# Patient Record
Sex: Female | Born: 1980 | Race: White | Hispanic: No | Marital: Married | Smoking: Never smoker
Health system: Southern US, Community
[De-identification: ages and names within clinical notes are randomized; demographics above are authoritative.]

## PROBLEM LIST (undated history)

## (undated) ENCOUNTER — Emergency Department: Admission: EM | Disposition: A | Discharge status: Home / Self Care | Payer: Self-pay

## (undated) DIAGNOSIS — J189 Pneumonia, unspecified organism: Secondary | ICD-10-CM

## (undated) DIAGNOSIS — N200 Calculus of kidney: Secondary | ICD-10-CM

## (undated) DIAGNOSIS — M519 Unspecified thoracic, thoracolumbar and lumbosacral intervertebral disc disorder: Secondary | ICD-10-CM

## (undated) DIAGNOSIS — K429 Umbilical hernia without obstruction or gangrene: Secondary | ICD-10-CM

## (undated) HISTORY — PX: HERNIA REPAIR: SHX51

## (undated) HISTORY — PX: APPENDECTOMY: SHX54

## (undated) HISTORY — PX: TUBAL LIGATION: SHX77

---

## 2010-06-30 ENCOUNTER — Ambulatory Visit: Payer: Self-pay | Admitting: Diagnostic Radiology

## 2010-06-30 ENCOUNTER — Emergency Department (HOSPITAL_BASED_OUTPATIENT_CLINIC_OR_DEPARTMENT_OTHER): Admission: EM | Admit: 2010-06-30 | Discharge: 2010-06-30 | Payer: Self-pay | Admitting: Emergency Medicine

## 2010-08-23 ENCOUNTER — Emergency Department (HOSPITAL_BASED_OUTPATIENT_CLINIC_OR_DEPARTMENT_OTHER): Admission: EM | Admit: 2010-08-23 | Discharge: 2010-08-23 | Payer: Self-pay | Admitting: Emergency Medicine

## 2010-08-30 ENCOUNTER — Emergency Department (HOSPITAL_BASED_OUTPATIENT_CLINIC_OR_DEPARTMENT_OTHER): Admission: EM | Admit: 2010-08-30 | Discharge: 2010-08-30 | Payer: Self-pay | Admitting: Emergency Medicine

## 2010-08-30 ENCOUNTER — Ambulatory Visit: Payer: Self-pay | Admitting: Interventional Radiology

## 2011-02-18 LAB — URINALYSIS, ROUTINE W REFLEX MICROSCOPIC
Bilirubin Urine: NEGATIVE
Hgb urine dipstick: NEGATIVE
Nitrite: NEGATIVE
Specific Gravity, Urine: 1.022 (ref 1.005–1.030)

## 2011-02-18 LAB — PREGNANCY, URINE: Preg Test, Ur: NEGATIVE

## 2011-02-18 LAB — POCT I-STAT, CHEM 8
Creatinine, Ser: 0.8 mg/dL (ref 0.4–1.2)
Glucose, Bld: 115 mg/dL — ABNORMAL HIGH (ref 70–99)
HCT: 36 % (ref 36.0–46.0)
Potassium: 4.7 mEq/L (ref 3.5–5.1)
Sodium: 139 mEq/L (ref 135–145)
TCO2: 27 mmol/L (ref 0–100)

## 2011-02-18 LAB — COMPREHENSIVE METABOLIC PANEL
Albumin: 3.9 g/dL (ref 3.5–5.2)
Alkaline Phosphatase: 115 U/L (ref 39–117)
CO2: 30 mEq/L (ref 19–32)
Calcium: 9.7 mg/dL (ref 8.4–10.5)
GFR calc non Af Amer: >60 mL/min (ref >60)
Total Bilirubin: 0.6 mg/dL (ref 0.3–1.2)

## 2011-02-20 LAB — URINALYSIS, ROUTINE W REFLEX MICROSCOPIC
Glucose, UA: NEGATIVE mg/dL
Ketones, ur: NEGATIVE mg/dL
Protein, ur: NEGATIVE mg/dL
Specific Gravity, Urine: 1.029 (ref 1.005–1.030)
Urobilinogen, UA: 1 mg/dL (ref 0.0–1.0)
pH: 6 (ref 5.0–8.0)

## 2011-02-20 LAB — BASIC METABOLIC PANEL
BUN: 17 mg/dL (ref 6–23)
Calcium: 9.5 mg/dL (ref 8.4–10.5)
GFR calc Af Amer: >60 mL/min (ref >60)
Sodium: 144 mEq/L (ref 135–145)

## 2011-02-20 LAB — URINE MICROSCOPIC-ADD ON

## 2011-02-20 LAB — PREGNANCY, URINE: Preg Test, Ur: NEGATIVE

## 2011-02-20 LAB — URINE CULTURE

## 2011-03-16 ENCOUNTER — Emergency Department (INDEPENDENT_AMBULATORY_CARE_PROVIDER_SITE_OTHER): Payer: Self-pay

## 2011-03-16 ENCOUNTER — Emergency Department (HOSPITAL_BASED_OUTPATIENT_CLINIC_OR_DEPARTMENT_OTHER)
Admission: EM | Admit: 2011-03-16 | Discharge: 2011-03-16 | Disposition: A | Discharge status: Home / Self Care | Payer: Self-pay | Attending: Emergency Medicine | Admitting: Emergency Medicine

## 2011-03-16 DIAGNOSIS — R1110 Vomiting, unspecified: Secondary | ICD-10-CM

## 2011-03-16 DIAGNOSIS — N83209 Unspecified ovarian cyst, unspecified side: Secondary | ICD-10-CM | POA: Insufficient documentation

## 2011-03-16 DIAGNOSIS — R1031 Right lower quadrant pain: Secondary | ICD-10-CM

## 2011-03-16 DIAGNOSIS — R1900 Intra-abdominal and pelvic swelling, mass and lump, unspecified site: Secondary | ICD-10-CM

## 2011-03-16 DIAGNOSIS — B9689 Other specified bacterial agents as the cause of diseases classified elsewhere: Secondary | ICD-10-CM | POA: Insufficient documentation

## 2011-03-16 DIAGNOSIS — N760 Acute vaginitis: Secondary | ICD-10-CM | POA: Insufficient documentation

## 2011-03-16 DIAGNOSIS — A499 Bacterial infection, unspecified: Secondary | ICD-10-CM | POA: Insufficient documentation

## 2011-03-16 LAB — BASIC METABOLIC PANEL
CO2: 25 mEq/L (ref 19–32)
Calcium: 9.4 mg/dL (ref 8.4–10.5)
GFR calc Af Amer: >60 mL/min (ref >60)
Glucose, Bld: 96 mg/dL (ref 70–99)
Potassium: 3.9 mEq/L (ref 3.5–5.1)
Sodium: 144 mEq/L (ref 135–145)

## 2011-03-16 LAB — BASIC METABOLIC PANEL WITH GFR
BUN: 16 mg/dL (ref 6–23)
Chloride: 104 meq/L (ref 96–112)
Creatinine, Ser: 0.7 mg/dL (ref 0.4–1.2)
GFR calc non Af Amer: >60 mL/min (ref >60)

## 2011-03-16 LAB — URINALYSIS, ROUTINE W REFLEX MICROSCOPIC
Bilirubin Urine: NEGATIVE
Glucose, UA: NEGATIVE mg/dL
Hgb urine dipstick: NEGATIVE
Ketones, ur: NEGATIVE mg/dL
Nitrite: NEGATIVE
Protein, ur: NEGATIVE mg/dL
Specific Gravity, Urine: 1.028 (ref 1.005–1.030)
Urobilinogen, UA: 0.2 mg/dL (ref 0.0–1.0)
pH: 6 (ref 5.0–8.0)

## 2011-03-16 LAB — WET PREP, GENITAL
Trich, Wet Prep: NONE SEEN
Yeast Wet Prep HPF POC: NONE SEEN

## 2011-03-16 LAB — PREGNANCY, URINE: Preg Test, Ur: NEGATIVE

## 2011-03-17 LAB — GC/CHLAMYDIA PROBE AMP, GENITAL
Chlamydia, DNA Probe: POSITIVE — AB
GC Probe Amp, Genital: NEGATIVE

## 2011-07-27 ENCOUNTER — Emergency Department (HOSPITAL_BASED_OUTPATIENT_CLINIC_OR_DEPARTMENT_OTHER)
Admission: EM | Admit: 2011-07-27 | Discharge: 2011-07-28 | Disposition: A | Discharge status: Home / Self Care | Payer: Self-pay | Attending: Emergency Medicine | Admitting: Emergency Medicine

## 2011-07-27 ENCOUNTER — Encounter: Payer: Self-pay | Admitting: *Deleted

## 2011-07-27 DIAGNOSIS — T81329A Deep disruption or dehiscence of operation wound, unspecified, initial encounter: Secondary | ICD-10-CM | POA: Insufficient documentation

## 2011-07-27 DIAGNOSIS — T8130XA Disruption of wound, unspecified, initial encounter: Secondary | ICD-10-CM

## 2011-07-27 DIAGNOSIS — R110 Nausea: Secondary | ICD-10-CM | POA: Insufficient documentation

## 2011-07-27 DIAGNOSIS — T8132XA Disruption of internal operation (surgical) wound, not elsewhere classified, initial encounter: Secondary | ICD-10-CM | POA: Insufficient documentation

## 2011-07-27 DIAGNOSIS — Y838 Other surgical procedures as the cause of abnormal reaction of the patient, or of later complication, without mention of misadventure at the time of the procedure: Secondary | ICD-10-CM | POA: Insufficient documentation

## 2011-07-27 HISTORY — DX: Umbilical hernia without obstruction or gangrene: K42.9

## 2011-07-27 HISTORY — DX: Calculus of kidney: N20.0

## 2011-07-27 LAB — URINALYSIS, ROUTINE W REFLEX MICROSCOPIC
Glucose, UA: NEGATIVE mg/dL
Ketones, ur: NEGATIVE mg/dL
Protein, ur: NEGATIVE mg/dL
Specific Gravity, Urine: 1.037 — ABNORMAL HIGH (ref 1.005–1.030)
Urobilinogen, UA: 1 mg/dL (ref 0.0–1.0)

## 2011-07-27 NOTE — ED Notes (Signed)
Pt states that she did have a postoperative infection following the hernia surgery, and that she was admitted into the hospital for ABX infusions. States she has not had pain to site since that time. States incision site has had drainage today, and ? Partially dehiscing. States pain is severe and is taking tylenol without relief.

## 2011-07-27 NOTE — ED Notes (Signed)
Pt reports abdominal pain since yesterday, worsened today. States she had an umbilical hernia removed on May 1st of this year, and the pain is over the incision site. Denies urinary or vaginal symptoms. +nausea, but denies vomiting or diarrhea

## 2011-07-28 ENCOUNTER — Encounter (HOSPITAL_BASED_OUTPATIENT_CLINIC_OR_DEPARTMENT_OTHER): Payer: Self-pay | Admitting: *Deleted

## 2011-07-28 ENCOUNTER — Emergency Department (INDEPENDENT_AMBULATORY_CARE_PROVIDER_SITE_OTHER): Payer: Self-pay

## 2011-07-28 DIAGNOSIS — R109 Unspecified abdominal pain: Secondary | ICD-10-CM

## 2011-07-28 DIAGNOSIS — Y838 Other surgical procedures as the cause of abnormal reaction of the patient, or of later complication, without mention of misadventure at the time of the procedure: Secondary | ICD-10-CM

## 2011-07-28 DIAGNOSIS — IMO0002 Reserved for concepts with insufficient information to code with codable children: Secondary | ICD-10-CM

## 2011-07-28 LAB — COMPREHENSIVE METABOLIC PANEL
ALT: 43 U/L — ABNORMAL HIGH (ref 0–35)
AST: 28 U/L (ref 0–37)
Alkaline Phosphatase: 115 U/L (ref 39–117)
CO2: 27 mEq/L (ref 19–32)
Calcium: 10.5 mg/dL (ref 8.4–10.5)
GFR calc non Af Amer: >60 mL/min (ref >60)
Potassium: 3.5 mEq/L (ref 3.5–5.1)
Sodium: 138 mEq/L (ref 135–145)

## 2011-07-28 LAB — CBC
MCV: 78.5 fL (ref 78.0–100.0)
Platelets: 220 10*3/uL (ref 150–400)
RBC: 4.75 MIL/uL (ref 3.87–5.11)
RDW: 13.1 % (ref 11.5–15.5)
WBC: 8.8 10*3/uL (ref 4.0–10.5)

## 2011-07-28 LAB — DIFFERENTIAL
Basophils Absolute: 0 10*3/uL (ref 0.0–0.1)
Eosinophils Relative: 1 % (ref 0–5)
Lymphocytes Relative: 38 % (ref 12–46)
Lymphs Abs: 3.4 10*3/uL (ref 0.7–4.0)
Neutro Abs: 4.7 10*3/uL (ref 1.7–7.7)
Neutrophils Relative %: 53 % (ref 43–77)

## 2011-07-28 MED ORDER — HYDROMORPHONE HCL 1 MG/ML IJ SOLN: 1.0000 mg | Freq: Once | INTRAMUSCULAR | Status: DC

## 2011-07-28 MED ORDER — IOHEXOL 300 MG/ML  SOLN
100.0000 mL | Freq: Once | INTRAMUSCULAR | Status: AC | PRN
  Administered 2011-07-28: 100 mL via INTRAVENOUS

## 2011-07-28 MED ORDER — ONDANSETRON HCL 4 MG/2ML IJ SOLN
4.0000 mg | Freq: Once | INTRAMUSCULAR | Status: AC
  Administered 2011-07-28: 4 mg via INTRAVENOUS
  Filled 2011-07-28: qty 2

## 2011-07-28 MED ORDER — HYDROMORPHONE HCL 1 MG/ML IJ SOLN
1.0000 mg | INTRAMUSCULAR | Status: DC
  Administered 2011-07-28: 1 mg via INTRAVENOUS
  Filled 2011-07-28: qty 1

## 2011-07-28 MED ORDER — DOXYCYCLINE HYCLATE 100 MG PO CAPS: 100.0000 mg | ORAL_CAPSULE | Freq: Two times a day (BID) | ORAL | Status: AC

## 2011-07-28 MED ORDER — HYDROMORPHONE HCL 1 MG/ML IJ SOLN
1.0000 mg | Freq: Once | INTRAMUSCULAR | Status: AC
  Administered 2011-07-28: 1 mg via INTRAVENOUS
  Filled 2011-07-28: qty 1

## 2011-07-28 MED ORDER — DOXYCYCLINE HYCLATE 100 MG PO TABS
100.0000 mg | ORAL_TABLET | Freq: Once | ORAL | Status: AC
  Administered 2011-07-28: 100 mg via ORAL
  Filled 2011-07-28: qty 1

## 2011-07-28 MED ORDER — CEPHALEXIN 500 MG PO CAPS: 500.0000 mg | ORAL_CAPSULE | Freq: Four times a day (QID) | ORAL | Status: AC

## 2011-07-28 MED ORDER — CEPHALEXIN 250 MG PO CAPS
250.0000 mg | ORAL_CAPSULE | Freq: Once | ORAL | Status: AC
  Administered 2011-07-28: 250 mg via ORAL
  Filled 2011-07-28: qty 1

## 2011-07-28 MED ORDER — OXYCODONE-ACETAMINOPHEN 5-325 MG PO TABS: 2.0000 | ORAL_TABLET | ORAL | Status: AC | PRN

## 2011-07-28 NOTE — ED Provider Notes (Addendum)
History     CSN: [id number] Arrival date & time: 07/27/2011 11:29 PM  Chief Complaint  Patient presents with  . Abdominal Pain   Patient is a 30 y.o. female presenting with abdominal pain. The history is provided by the patient. No language interpreter was used.  Abdominal Pain The primary symptoms of the illness include abdominal pain and nausea. The primary symptoms of the illness do not include fever, vomiting, hematochezia, dysuria, vaginal discharge or vaginal bleeding. The current episode started 13 to 24 hours ago. The onset of the illness was gradual. The problem has not changed since onset. The patient states that she believes she is currently not pregnant. The patient has not had a change in bowel habit. Risk factors for an acute abdominal problem include a history of abdominal surgery. Symptoms associated with the illness do not include chills, anorexia, diaphoresis, heartburn, constipation, urgency, hematuria, frequency or back pain. Significant associated medical issues do not include PUD or substance abuse.  Opening of abdominal wound just below umbilicus from May 2012 hernia surgery with serosanguinous drainage.  No f/c/r. No vaginal sx.  No rashes.    Past Medical History  Diagnosis Date  . Umbilical hernia   . Kidney stones     Past Surgical History  Procedure Date  . Appendectomy   . Cesarean section   . Tubal ligation     History reviewed. No pertinent family history.  History  Substance Use Topics  . Smoking status: Never Smoker   . Smokeless tobacco: Not on file  . Alcohol Use: No    OB History    Grav Para Term Preterm Abortions TAB SAB Ect Mult Living                  Review of Systems  Constitutional: Negative for fever, chills and diaphoresis.  Respiratory: Negative for apnea.   Cardiovascular: Negative for chest pain.  Gastrointestinal: Positive for nausea and abdominal pain. Negative for heartburn, vomiting, constipation, hematochezia and  anorexia.  Genitourinary: Positive for difficulty urinating. Negative for dysuria, urgency, frequency, hematuria, vaginal bleeding and vaginal discharge.  Musculoskeletal: Negative for back pain.  Skin: Negative for color change.  Neurological: Negative for dizziness.  Hematological: Negative for adenopathy.  Psychiatric/Behavioral: Negative for agitation.    Physical Exam  BP 134/59  Pulse 100  Temp(Src) 98.5 F (36.9 C) (Oral)  Resp 20  Ht 5\' 4"  (1.626 m)  Wt 295 lb (133.811 kg)  BMI 50.64 kg/m2  SpO2 96%  LMP 07/07/2011  Physical Exam  Constitutional: She is oriented to person, place, and time. She appears well-developed and well-nourished. No distress.  HENT:  Head: Normocephalic and atraumatic.  Eyes: EOM are normal. Pupils are equal, round, and reactive to light.  Neck: Normal range of motion. Neck supple.  Cardiovascular: Normal rate and regular rhythm.   Pulmonary/Chest: Effort normal and breath sounds normal. No respiratory distress.  Abdominal: Soft. Bowel sounds are normal. She exhibits no distension. There is tenderness. There is rebound. There is no guarding.       2 cm opening at incision site just below umbilicus, with serous drainage.  No warmth erythema or induration  Musculoskeletal: Normal range of motion. She exhibits no edema.  Neurological: She is alert and oriented to person, place, and time.  Skin: Skin is warm and dry.  Psychiatric: She has a normal mood and affect.    ED Course  Procedures  MDM Return immediately for fevers, chills, worsening pain or inability to  tolerate medications or streaking, return for wound check in 48 hours.  Call your surgeon in the am.  Patient verbalizes understanding and agrees to follow up      Michaeal Davis K Sherida Dobkins-Rasch, MD 07/28/11 0213  Jasmine Awe, MD 07/28/11 [phone]

## 2011-10-23 ENCOUNTER — Emergency Department (HOSPITAL_BASED_OUTPATIENT_CLINIC_OR_DEPARTMENT_OTHER)
Admission: EM | Admit: 2011-10-23 | Discharge: 2011-10-23 | Disposition: A | Discharge status: Home / Self Care | Payer: Self-pay

## 2011-10-24 ENCOUNTER — Emergency Department (HOSPITAL_BASED_OUTPATIENT_CLINIC_OR_DEPARTMENT_OTHER)
Admission: EM | Admit: 2011-10-24 | Discharge: 2011-10-24 | Disposition: A | Discharge status: Home / Self Care | Payer: Self-pay | Attending: Emergency Medicine | Admitting: Emergency Medicine

## 2011-10-24 ENCOUNTER — Encounter (HOSPITAL_BASED_OUTPATIENT_CLINIC_OR_DEPARTMENT_OTHER): Payer: Self-pay | Admitting: *Deleted

## 2011-10-24 DIAGNOSIS — M549 Dorsalgia, unspecified: Secondary | ICD-10-CM | POA: Insufficient documentation

## 2011-10-24 HISTORY — DX: Unspecified thoracic, thoracolumbar and lumbosacral intervertebral disc disorder: M51.9

## 2011-10-24 MED ORDER — OXYCODONE-ACETAMINOPHEN 5-325 MG PO TABS: 2.0000 | ORAL_TABLET | ORAL | Status: AC | PRN

## 2011-10-24 MED ORDER — KETOROLAC TROMETHAMINE 60 MG/2ML IM SOLN
60.0000 mg | Freq: Once | INTRAMUSCULAR | Status: AC
  Administered 2011-10-24: 60 mg via INTRAMUSCULAR
  Filled 2011-10-24: qty 2

## 2011-10-24 NOTE — ED Provider Notes (Signed)
History/physical exam/procedure(s) were performed by non-physician practitioner and as supervising physician I was immediately available for consultation/collaboration. I have reviewed all notes and am in agreement with care and plan.   Sisto Granillo S Yides Saidi, MD 10/24/11 1533 

## 2011-10-24 NOTE — ED Provider Notes (Signed)
History     CSN: [id number] Arrival date & time: 10/24/2011  1:17 PM   First MD Initiated Contact with Patient 10/24/11 1346      Chief Complaint  Patient presents with  . Back Pain    (Consider location/radiation/quality/duration/timing/severity/associated sxs/prior treatment) HPI Comments: With clarification:pt denies numbness and states that it feels like it is asleep with pins and needle sensation:pt states that she is having spasms in her leg  Patient is a 30 y.o. female presenting with back pain. The history is provided by the patient. No language interpreter was used.  Back Pain  This is a recurrent problem. The current episode started more than 1 week ago. The problem occurs constantly. The problem has not changed since onset.The pain is associated with no known injury. The pain is present in the lumbar spine. The quality of the pain is described as shooting and aching. The pain radiates to the right thigh. The pain is severe. The pain is the same all the time. Associated symptoms include paresis. Pertinent negatives include no perianal numbness, no dysuria and no weakness.    Past Medical History  Diagnosis Date  . Umbilical hernia   . Kidney stones   . Disc disorder     Past Surgical History  Procedure Date  . Appendectomy   . Cesarean section   . Tubal ligation   . Hernia repair     History reviewed. No pertinent family history.  History  Substance Use Topics  . Smoking status: Never Smoker   . Smokeless tobacco: Not on file  . Alcohol Use: No    OB History    Grav Para Term Preterm Abortions TAB SAB Ect Mult Living                  Review of Systems  Genitourinary: Negative for dysuria.  Musculoskeletal: Positive for back pain.  Neurological: Negative for weakness.  All other systems reviewed and are negative.    Allergies  Compazine  Home Medications  No current outpatient prescriptions on file.  BP 169/87  Pulse 104  Temp(Src) 98.4 F  (36.9 C) (Oral)  Resp 20  Ht 5\' 4"  (1.626 m)  Wt 298 lb (135.172 kg)  BMI 51.15 kg/m2  SpO2 99%  LMP 10/07/2011  Physical Exam  Nursing note and vitals reviewed. Constitutional: She is oriented to person, place, and time. She appears well-developed and well-nourished.  HENT:  Head: Normocephalic and atraumatic.  Cardiovascular: Normal rate and regular rhythm.   Pulmonary/Chest: Effort normal and breath sounds normal.  Musculoskeletal: Normal range of motion.       Pt tender right paraspinal along the lumbar spine  Neurological: She is oriented to person, place, and time.  Skin: Skin is warm and dry.  Psychiatric: She has a normal mood and affect.    ED Course  Procedures (including critical care time)  Labs Reviewed - No data to display No results found.   1. Back pain       MDM  Pt is not having any neuro deficit:pt is not having any weakness or numbness:will treat symptomatically        Teressa Lower, NP 10/24/11 1424

## 2011-10-24 NOTE — ED Notes (Signed)
Pt states that she has had low back pain with numbness to her right leg for one week. Saw Dr. Kendra Opitz on Friday. Dx'd with a "lumbar disintegrating disc" and PT was recommended. Pt unable to sleep for the last 2 nights. Requesting something for pain.

## 2011-12-01 ENCOUNTER — Emergency Department (INDEPENDENT_AMBULATORY_CARE_PROVIDER_SITE_OTHER): Payer: Self-pay

## 2011-12-01 ENCOUNTER — Encounter (HOSPITAL_BASED_OUTPATIENT_CLINIC_OR_DEPARTMENT_OTHER): Payer: Self-pay | Admitting: *Deleted

## 2011-12-01 ENCOUNTER — Inpatient Hospital Stay (HOSPITAL_BASED_OUTPATIENT_CLINIC_OR_DEPARTMENT_OTHER)
Admission: EM | Admit: 2011-12-01 | Discharge: 2011-12-03 | DRG: 552 | Disposition: A | Discharge status: Home / Self Care | Payer: Self-pay | Source: Ambulatory Visit | Attending: Internal Medicine | Admitting: Internal Medicine

## 2011-12-01 DIAGNOSIS — R262 Difficulty in walking, not elsewhere classified: Secondary | ICD-10-CM | POA: Diagnosis present

## 2011-12-01 DIAGNOSIS — M549 Dorsalgia, unspecified: Secondary | ICD-10-CM

## 2011-12-01 DIAGNOSIS — R339 Retention of urine, unspecified: Secondary | ICD-10-CM | POA: Diagnosis present

## 2011-12-01 DIAGNOSIS — M545 Low back pain, unspecified: Principal | ICD-10-CM | POA: Diagnosis present

## 2011-12-01 DIAGNOSIS — M25552 Pain in left hip: Secondary | ICD-10-CM

## 2011-12-01 DIAGNOSIS — W19XXXA Unspecified fall, initial encounter: Secondary | ICD-10-CM

## 2011-12-01 DIAGNOSIS — F40298 Other specified phobia: Secondary | ICD-10-CM | POA: Diagnosis present

## 2011-12-01 DIAGNOSIS — M25559 Pain in unspecified hip: Secondary | ICD-10-CM

## 2011-12-01 DIAGNOSIS — R3919 Other difficulties with micturition: Secondary | ICD-10-CM

## 2011-12-01 DIAGNOSIS — Z6841 Body Mass Index (BMI) 40.0 and over, adult: Secondary | ICD-10-CM

## 2011-12-01 DIAGNOSIS — T8189XA Other complications of procedures, not elsewhere classified, initial encounter: Secondary | ICD-10-CM | POA: Diagnosis present

## 2011-12-01 DIAGNOSIS — R270 Ataxia, unspecified: Secondary | ICD-10-CM

## 2011-12-01 DIAGNOSIS — Y849 Medical procedure, unspecified as the cause of abnormal reaction of the patient, or of later complication, without mention of misadventure at the time of the procedure: Secondary | ICD-10-CM | POA: Diagnosis present

## 2011-12-01 HISTORY — DX: Pneumonia, unspecified organism: J18.9

## 2011-12-01 LAB — URINALYSIS, ROUTINE W REFLEX MICROSCOPIC
Glucose, UA: NEGATIVE mg/dL
Hgb urine dipstick: NEGATIVE
Ketones, ur: 15 mg/dL — AB
Leukocytes, UA: NEGATIVE
pH: 6 (ref 5.0–8.0)

## 2011-12-01 LAB — BASIC METABOLIC PANEL
Calcium: 9.4 mg/dL (ref 8.4–10.5)
Creatinine, Ser: 0.7 mg/dL (ref 0.50–1.10)
GFR calc Af Amer: >90 mL/min (ref >90)
GFR calc non Af Amer: >90 mL/min (ref >90)
Sodium: 133 mEq/L — ABNORMAL LOW (ref 135–145)

## 2011-12-01 LAB — DIFFERENTIAL
Basophils Absolute: 0 10*3/uL (ref 0.0–0.1)
Eosinophils Relative: 0 % (ref 0–5)
Lymphocytes Relative: 12 % (ref 12–46)
Neutro Abs: 10.9 10*3/uL — ABNORMAL HIGH (ref 1.7–7.7)
Neutrophils Relative %: 82 % — ABNORMAL HIGH (ref 43–77)

## 2011-12-01 LAB — CBC
Platelets: 186 10*3/uL (ref 150–400)
RDW: 13.8 % (ref 11.5–15.5)
WBC: 13.4 10*3/uL — ABNORMAL HIGH (ref 4.0–10.5)

## 2011-12-01 LAB — URINE MICROSCOPIC-ADD ON

## 2011-12-01 LAB — PREGNANCY, URINE: Preg Test, Ur: NEGATIVE

## 2011-12-01 MED ORDER — KETOROLAC TROMETHAMINE 60 MG/2ML IM SOLN
60.0000 mg | Freq: Once | INTRAMUSCULAR | Status: AC
  Administered 2011-12-01: 60 mg via INTRAMUSCULAR
  Filled 2011-12-01: qty 2

## 2011-12-01 MED ORDER — ONDANSETRON 8 MG PO TBDP
8.0000 mg | ORAL_TABLET | Freq: Once | ORAL | Status: AC
  Administered 2011-12-01: 8 mg via ORAL
  Filled 2011-12-01: qty 1

## 2011-12-01 MED ORDER — ONDANSETRON HCL 4 MG/2ML IJ SOLN
4.0000 mg | Freq: Once | INTRAMUSCULAR | Status: AC
  Administered 2011-12-01: 4 mg via INTRAVENOUS
  Filled 2011-12-01: qty 2

## 2011-12-01 MED ORDER — SODIUM CHLORIDE 0.9 % IV SOLN
Freq: Once | INTRAVENOUS | Status: AC
  Administered 2011-12-01: 1000 mL via INTRAVENOUS

## 2011-12-01 MED ORDER — HYDROMORPHONE HCL PF 1 MG/ML IJ SOLN
1.0000 mg | INTRAMUSCULAR | Status: DC | PRN
  Filled 2011-12-01: qty 1

## 2011-12-01 MED ORDER — HYDROMORPHONE HCL PF 1 MG/ML IJ SOLN
1.0000 mg | Freq: Once | INTRAMUSCULAR | Status: AC
  Administered 2011-12-01: 1 mg via INTRAVENOUS
  Filled 2011-12-01: qty 1

## 2011-12-01 MED ORDER — HYDROMORPHONE HCL PF 1 MG/ML IJ SOLN
1.0000 mg | Freq: Once | INTRAMUSCULAR | Status: AC
  Administered 2011-12-01: 1 mg via INTRAVENOUS

## 2011-12-01 NOTE — ED Notes (Signed)
Pt c/o unable to void x 12 hrs

## 2011-12-01 NOTE — ED Notes (Signed)
Returned from [employer] on side ,states both legs were numb in xray but now feeling coming back in one

## 2011-12-01 NOTE — ED Provider Notes (Signed)
Reports had multiple falls at home this morning, landed on left hip is only complains of left hip pain and low back pain and unable to urinate for the past 12 hours. THERE was placed by the nurse which had approximately 500 mL of urine. She treated for pneumonia a few days ago. Reports having trouble moving her left leg On exam morbidly obese alert nontoxic Glasgow Coma Score 15 entire spine is nontender lungs clear to auscultation abdomen is a bandage wound which is chronic. Pelvis is stable she is mildly tender at the left hip. She reports being unable to move left leg however on changing positions in bed and with distraction she does have some movement of the left lower extremity at hip and knee . Rectal exam shows normal on DTRs are absent at knee jerk and ankle jerks bilaterally. Will obtain x-rays to rule out fracture. I feel that patient needs MRI to rule out discitis or cauda equina syndrome. Conversion reaction is also a possibility  Doug Sou, MD 12/01/11 2008

## 2011-12-01 NOTE — ED Notes (Signed)
Report given to Paula,RN with [facility]. 

## 2011-12-01 NOTE — ED Provider Notes (Signed)
History     CSN: [id number]  Arrival date & time 12/01/11  1746   First MD Initiated Contact with Patient 12/01/11 1843     7:12 PM HPI Patient reports several weeks of back pain. States pain has worsened. Reports frequent falls that began this morning do to severe pain in back and hip. Describes it as "feels like my leg is giving out on me". States she has noticed that since her fall she is not been able to urinate. Reports this is what brought her to the emergency department. States she feels is associated with lower abdominal pain has gradually worsened. States pain in her lower abdomen seems to begin in her lower back and radiates around her to her lower abdomen. States the last time she urinated was this morning when she woke up. Denies dysuria, hematuria, urinary frequency, urinary urgency.     Patient is a 30 y.o. female presenting with hip pain. The history is provided by the patient.  Hip Pain This is a new problem. The current episode started more than 1 month ago. The problem occurs constantly. The problem has been gradually worsening. Associated symptoms include numbness, urinary symptoms (urinary retention) and weakness. Pertinent negatives include no abdominal pain, chest pain, chills, fever, headaches, neck pain or vomiting. The symptoms are aggravated by bending, walking and standing. She has tried nothing for the symptoms.    Past Medical History  Diagnosis Date  . Umbilical hernia   . Kidney stones   . Disc disorder   . Pneumonia     Past Surgical History  Procedure Date  . Appendectomy   . Cesarean section   . Tubal ligation   . Hernia repair     History reviewed. No pertinent family history.  History  Substance Use Topics  . Smoking status: Never Smoker   . Smokeless tobacco: Not on file  . Alcohol Use: No    OB History    Grav Para Term Preterm Abortions TAB SAB Ect Mult Living                  Review of Systems  Constitutional: Negative for  fever and chills.  HENT: Negative for neck pain and neck stiffness.   Cardiovascular: Negative for chest pain.  Gastrointestinal: Negative for vomiting and abdominal pain.  Genitourinary: Negative for dysuria, urgency, frequency, hematuria, flank pain and pelvic pain.  Musculoskeletal: Positive for back pain.       Back and hip pain. Lateral thigh numbness. Denies saddle anesthesias, or perineal numbness, urinary or bowel incontinence  Neurological: Positive for weakness and numbness. Negative for headaches.  All other systems reviewed and are negative.    Allergies  Compazine  Home Medications   Current Outpatient Rx  Name Route Sig Dispense Refill  . ACETAMINOPHEN 500 MG PO TABS Oral Take 1,000 mg by mouth every 8 (eight) hours as needed. For fever     . HYDROCOD POLST-CHLORPHEN POLST 10-8 MG/5ML PO LQCR Oral Take 5 mLs by mouth every 12 (twelve) hours as needed. For cough     . DOXYCYCLINE HYCLATE 100 MG PO CAPS Oral Take 100 mg by mouth 2 (two) times daily.        BP 108/42  Pulse 116  Temp(Src) 98.5 F (36.9 C) (Oral)  Resp 18  Ht 5\' 4"  (1.626 m)  Wt 323 lb (146.512 kg)  BMI 55.44 kg/m2  SpO2 98%  LMP 11/06/2011  Physical Exam  Vitals reviewed. Constitutional: She is oriented to  person, place, and time. Vital signs are normal. She appears well-developed and well-nourished.  HENT:  Head: Normocephalic and atraumatic.  Eyes: Conjunctivae are normal. Pupils are equal, round, and reactive to light.  Neck: Normal range of motion. Neck supple.  Cardiovascular: Normal rate, regular rhythm and normal heart sounds.  Exam reveals no friction rub.   No murmur heard. Pulmonary/Chest: Effort normal and breath sounds normal. She has no wheezes. She has no rhonchi. She has no rales. She exhibits no tenderness.  Abdominal: Soft. Bowel sounds are normal. She exhibits no distension and no mass. There is no tenderness. There is no rebound and no guarding.       Morbid obesity. 2 cm  open draining wound in the perimbilical region which is chronic and unchanged according to patient  Musculoskeletal:       Left hip: She exhibits decreased range of motion, decreased strength and tenderness. She exhibits no laceration.       Lumbar back: Normal. She exhibits no tenderness, no bony tenderness and no swelling.       Legs:      Patient reports unable to move her LLE due to pain. Unable to push against resistance with her feet. Felt a vague pulse. This is difficult to assess due to large body habitus.  Neurological: She is alert and oriented to person, place, and time. Coordination normal.  Skin: Skin is warm and dry. No rash noted. No erythema. No pallor.    ED Course  Procedures  Results for orders placed during the hospital encounter of 12/01/11  URINALYSIS, ROUTINE W REFLEX MICROSCOPIC      Component Value Range   Color, Urine YELLOW  YELLOW    APPearance CLEAR  CLEAR    Specific Gravity, Urine 1.025  1.005 - 1.030    pH 6.0  5.0 - 8.0    Glucose, UA NEGATIVE  NEGATIVE (mg/dL)   Hgb urine dipstick NEGATIVE  NEGATIVE    Bilirubin Urine NEGATIVE  NEGATIVE    Ketones, ur 15 (*) NEGATIVE (mg/dL)   Protein, ur 30 (*) NEGATIVE (mg/dL)   Urobilinogen, UA 1.0  0.0 - 1.0 (mg/dL)   Nitrite NEGATIVE  NEGATIVE    Leukocytes, UA NEGATIVE  NEGATIVE   PREGNANCY, URINE      Component Value Range   Preg Test, Ur NEGATIVE    URINE MICROSCOPIC-ADD ON      Component Value Range   Squamous Epithelial / LPF RARE  RARE    RBC / HPF 0-2  <3 (RBC/hpf)   Bacteria, UA RARE  RARE   BASIC METABOLIC PANEL      Component Value Range   Sodium 133 (*) 135 - 145 (mEq/L)   Potassium 3.3 (*) 3.5 - 5.1 (mEq/L)   Chloride 94 (*) 96 - 112 (mEq/L)   CO2 26  19 - 32 (mEq/L)   Glucose, Bld 121 (*) 70 - 99 (mg/dL)   BUN 14  6 - 23 (mg/dL)   Creatinine, Ser 0.45  0.50 - 1.10 (mg/dL)   Calcium 9.4  8.4 - 40.9 (mg/dL)   GFR calc non Af Amer >90  >90 (mL/min)   GFR calc Af Amer >90  >90 (mL/min)    CBC      Component Value Range   WBC 13.4 (*) 4.0 - 10.5 (K/uL)   RBC 4.49  3.87 - 5.11 (MIL/uL)   Hemoglobin 11.1 (*) 12.0 - 15.0 (g/dL)   HCT 81.1 (*) 91.4 - 46.0 (%)   MCV  75.9 (*) 78.0 - 100.0 (fL)   MCH 24.7 (*) 26.0 - 34.0 (pg)   MCHC 32.6  30.0 - 36.0 (g/dL)   RDW 86.5  78.4 - 69.6 (%)   Platelets 186  150 - 400 (K/uL)  DIFFERENTIAL      Component Value Range   Neutrophils Relative 82 (*) 43 - 77 (%)   Neutro Abs 10.9 (*) 1.7 - 7.7 (K/uL)   Lymphocytes Relative 12  12 - 46 (%)   Lymphs Abs 1.7  0.7 - 4.0 (K/uL)   Monocytes Relative 6  3 - 12 (%)   Monocytes Absolute 0.8  0.1 - 1.0 (K/uL)   Eosinophils Relative 0  0 - 5 (%)   Eosinophils Absolute 0.0  0.0 - 0.7 (K/uL)   Basophils Relative 0  0 - 1 (%)   Basophils Absolute 0.0  0.0 - 0.1 (K/uL)   Dg Thoracic Spine 4v  12/01/2011  *RADIOLOGY REPORT*  Clinical Data: Status post multiple falls, with lower back pain and left hip pain.  THORACIC SPINE - 4+ VIEW  Comparison: Chest radiograph performed 08/30/2010, and CT of the abdomen and pelvis performed 08/20 01/2011  Findings: There is no evidence of acute fracture or subluxation. There is mild anterior loss of height at vertebral bodies the T11, T12 and L1, stable from the prior CT.  Vertebral bodies otherwise demonstrate normal height and alignment.  There is narrowing of the intervertebral disc space at T11-T12.  The visualized portions of both lungs are clear.  The mediastinum is unremarkable in appearance.  Contrast is noted within the colon.  IMPRESSION:  1.  No evidence of acute fracture or subluxation. 2.  Mild anterior loss of height at T11, T12 and L1 is stable from prior CT, with mild degenerative change; some of this may be developmental in nature.  Original Report Authenticated By: Tonia Ghent, M.D.   Dg Lumbar Spine Complete  12/01/2011  *RADIOLOGY REPORT*  Clinical Data: Status post multiple falls, with lower back pain and inability to urinate for 12 hours.  LUMBAR  SPINE - COMPLETE 4+ VIEW  Comparison: CT of the abdomen and pelvis performed 07/28/2011  Findings: There is no evidence of acute fracture or subluxation. Mild anterior loss of height at T11, T12 and L1 is stable from prior CT, with mild degenerative change at T11-T12.  Vertebral bodies demonstrate normal alignment.  The visualized bowel gas pattern is unremarkable in appearance; air and stool are noted within the colon.  The sacroiliac joints are within normal limits.  A right-sided tubal ligation clip is again noted; no left-sided tubal ligation clip is seen.  IMPRESSION:  1.  No evidence of acute fracture or subluxation along the lumbar spine. 2.  Stable mild anterior loss of height at T11 and T12 and L1; this may be developmental in nature. 3.  Right-sided tubal ligation clip noted; no left-sided tubal ligation clip seen.  This may reflect prior removal of the left- sided tubal ligation clip.  Original Report Authenticated By: Tonia Ghent, M.D.   Dg Hip Complete Left  12/01/2011  *RADIOLOGY REPORT*  Clinical Data: Status post multiple falls, with left hip pain.  LEFT HIP - COMPLETE 2+ VIEW  Comparison: None.  Findings: There is no evidence of fracture or dislocation.  Both femoral heads are seated normally within their respective acetabula.  The proximal left femur appears intact.  No significant degenerative change is appreciated.  The sacroiliac joints are unremarkable in appearance.  Note is made of  mild diastasis of the pubic symphysis, measuring 1.5 cm.  The visualized bowel gas pattern is grossly unremarkable in appearance.  The patient is status post tubal ligation; no left- sided clip is seen.  On correlation with the prior CT, only a right- sided clip is present; this may reflect prior removal of a left- sided clip.  IMPRESSION:  1.  No evidence of fracture or dislocation. 2.  Mild diastases of the pubic symphysis, measuring 1.5 cm. 3.  No left-sided tubal ligation clip seen.  This was also the  case on prior CTs; this may reflect prior removal of a left-sided clip. Suggest clinical correlation.  Original Report Authenticated By: Tonia Ghent, M.D.      MDM          Thomasene Lot, PA 12/01/11 2155  Spoke with Dr. Toniann Fail, he'll take the patient for admission at [facility] to a regular bed, team 4.  Thomasene Lot, [place] 12/01/11 2218

## 2011-12-01 NOTE — ED Notes (Signed)
Foley removed(500cc of urine returned)  Assisted to BR.  States can't move left leg,that she has fallen several tmes today because her leg gave way

## 2011-12-01 NOTE — ED Notes (Signed)
Has now gotten out of bed to chair states more comfortable there, was not assisted by nursing staff

## 2011-12-02 ENCOUNTER — Inpatient Hospital Stay (HOSPITAL_COMMUNITY): Payer: Self-pay

## 2011-12-02 ENCOUNTER — Encounter (HOSPITAL_COMMUNITY): Payer: Self-pay | Admitting: Internal Medicine

## 2011-12-02 DIAGNOSIS — M545 Low back pain: Secondary | ICD-10-CM | POA: Diagnosis present

## 2011-12-02 LAB — COMPREHENSIVE METABOLIC PANEL
ALT: 18 U/L (ref 0–35)
AST: 21 U/L (ref 0–37)
CO2: 27 mEq/L (ref 19–32)
Calcium: 9 mg/dL (ref 8.4–10.5)
Chloride: 99 mEq/L (ref 96–112)
GFR calc non Af Amer: >90 mL/min (ref >90)
Potassium: 2.9 mEq/L — ABNORMAL LOW (ref 3.5–5.1)
Sodium: 137 mEq/L (ref 135–145)

## 2011-12-02 LAB — RAPID URINE DRUG SCREEN, HOSP PERFORMED
Barbiturates: NOT DETECTED
Benzodiazepines: NOT DETECTED
Cocaine: NOT DETECTED
Tetrahydrocannabinol: NOT DETECTED

## 2011-12-02 LAB — CBC
Hemoglobin: 10 g/dL — ABNORMAL LOW (ref 12.0–15.0)
MCH: 24.6 pg — ABNORMAL LOW (ref 26.0–34.0)
MCHC: 31.5 g/dL (ref 30.0–36.0)

## 2011-12-02 LAB — MAGNESIUM: Magnesium: 2.1 mg/dL (ref 1.5–2.5)

## 2011-12-02 MED ORDER — LORAZEPAM 1 MG PO TABS
2.0000 mg | ORAL_TABLET | Freq: Four times a day (QID) | ORAL | Status: DC | PRN
  Administered 2011-12-02: 1 mg via ORAL
  Administered 2011-12-03 (×2): 2 mg via ORAL
  Filled 2011-12-02: qty 1
  Filled 2011-12-02 (×2): qty 2

## 2011-12-02 MED ORDER — LORAZEPAM 2 MG/ML IJ SOLN
INTRAMUSCULAR | Status: AC
  Administered 2011-12-02: 1 mg
  Filled 2011-12-02: qty 1

## 2011-12-02 MED ORDER — ONDANSETRON HCL 4 MG PO TABS: 4.0000 mg | ORAL_TABLET | Freq: Four times a day (QID) | ORAL | Status: DC | PRN

## 2011-12-02 MED ORDER — MOXIFLOXACIN HCL 400 MG PO TABS
400.0000 mg | ORAL_TABLET | Freq: Every day | ORAL | Status: DC
  Administered 2011-12-02 – 2011-12-03 (×2): 400 mg via ORAL
  Filled 2011-12-02 (×2): qty 1

## 2011-12-02 MED ORDER — LORAZEPAM 1 MG PO TABS
1.0000 mg | ORAL_TABLET | Freq: Once | ORAL | Status: DC
  Filled 2011-12-02: qty 1

## 2011-12-02 MED ORDER — POLYETHYLENE GLYCOL 3350 17 G PO PACK
17.0000 g | PACK | Freq: Every day | ORAL | Status: DC
  Filled 2011-12-02 (×2): qty 1

## 2011-12-02 MED ORDER — ACETAMINOPHEN 650 MG RE SUPP: 650.0000 mg | Freq: Four times a day (QID) | RECTAL | Status: DC | PRN

## 2011-12-02 MED ORDER — LORAZEPAM 1 MG PO TABS
1.0000 mg | ORAL_TABLET | Freq: Four times a day (QID) | ORAL | Status: DC | PRN
  Administered 2011-12-02: 1 mg via ORAL
  Filled 2011-12-02: qty 1

## 2011-12-02 MED ORDER — SODIUM CHLORIDE 0.9 % IV SOLN
INTRAVENOUS | Status: DC
  Administered 2011-12-02: 06:00:00 via INTRAVENOUS

## 2011-12-02 MED ORDER — IOHEXOL 300 MG/ML  SOLN: 100.0000 mL | Freq: Once | INTRAMUSCULAR | Status: AC | PRN

## 2011-12-02 MED ORDER — ONDANSETRON HCL 4 MG/2ML IJ SOLN
4.0000 mg | Freq: Four times a day (QID) | INTRAMUSCULAR | Status: DC | PRN
  Administered 2011-12-02: 4 mg via INTRAVENOUS
  Filled 2011-12-02: qty 2

## 2011-12-02 MED ORDER — HYDROMORPHONE HCL PF 1 MG/ML IJ SOLN
2.0000 mg | INTRAMUSCULAR | Status: DC | PRN
  Administered 2011-12-02: 2 mg via INTRAVENOUS
  Administered 2011-12-02: 1 mg via INTRAVENOUS
  Administered 2011-12-02: 2 mg via INTRAVENOUS
  Administered 2011-12-02: 1 mg via INTRAVENOUS
  Administered 2011-12-02 (×2): 2 mg via INTRAVENOUS
  Administered 2011-12-02: 1 mg via INTRAVENOUS
  Administered 2011-12-03 (×6): 2 mg via INTRAVENOUS
  Filled 2011-12-02 (×3): qty 2
  Filled 2011-12-02 (×2): qty 1
  Filled 2011-12-02 (×8): qty 2

## 2011-12-02 MED ORDER — HYDROMORPHONE HCL PF 1 MG/ML IJ SOLN
1.0000 mg | INTRAMUSCULAR | Status: DC | PRN
  Administered 2011-12-02 (×2): 1 mg via INTRAVENOUS
  Filled 2011-12-02 (×3): qty 1

## 2011-12-02 MED ORDER — HYDROMORPHONE HCL PF 1 MG/ML IJ SOLN
0.5000 mg | INTRAMUSCULAR | Status: DC | PRN
  Administered 2011-12-02: 03:00:00 via INTRAVENOUS
  Filled 2011-12-02: qty 1

## 2011-12-02 MED ORDER — HYDROMORPHONE HCL PF 1 MG/ML IJ SOLN
1.0000 mg | INTRAMUSCULAR | Status: AC
  Administered 2011-12-02: 1 mg via INTRAVENOUS
  Filled 2011-12-02: qty 1

## 2011-12-02 MED ORDER — ACETAMINOPHEN 325 MG PO TABS
650.0000 mg | ORAL_TABLET | Freq: Four times a day (QID) | ORAL | Status: DC | PRN
  Administered 2011-12-02 – 2011-12-03 (×4): 650 mg via ORAL
  Filled 2011-12-02 (×4): qty 2

## 2011-12-02 NOTE — Progress Notes (Signed)
Physical Therapy Cancellation Patient Details Name: Leslie Castillo MRN: [id number] DOB: August 30, 1981 Today's Date: 12/02/2011  Pt refuses PT evaluation at this time due to pain.  RN calling MD for pain medication.  Will attempt eval tomorrow.  Thanks!!  Natina Wiginton 12/02/2011, 2:55 PM [phone]

## 2011-12-02 NOTE — Progress Notes (Signed)
Subjective: Patient relates she still a significant, after milligram of Dilaudid. She relates she's not hungry Objective: Filed Vitals:   12/01/11 2312 12/01/11 2326 12/02/11 0046 12/02/11 0608  BP: 103/36  125/76 129/75  Pulse: 105  96 98  Temp:   98.9 F (37.2 C) 98.3 F (36.8 C)  TempSrc:      Resp:   20 20  Height:  5\' 4"  (1.626 m)    Weight:  146.512 kg (323 lb)    SpO2: 95%  97% 97%   Weight change:   Intake/Output Summary (Last 24 hours) at 12/02/11 0914 Last data filed at 12/02/11 0225  Gross per 24 hour  Intake      0 ml  Output    650 ml  Net   -650 ml    General: Alert, awake, oriented x3, in no acute distress.  HEENT: No bruits, no goiter.  Heart: Regular rate and rhythm, without murmurs, rubs, gallops.  Lungs: Crackles left side, bilateral air movement.  Abdomen: Soft, nontender, nondistended, positive bowel sounds.  Neuro: Grossly intact, nonfocal. Straight leg raise positive on the left negative on the right sensation is intact bilaterally. Motor strength is 5 of 5 in all 4 extremities except the left lower extremity which is limited by pain.  Extremities: Mottle skin.   Lab Results:  [facility] 12/02/11 0550 12/01/11 2013  NA 137 133*  K 2.9* 3.3*  CL 99 94*  CO2 27 26  GLUCOSE 87 121*  BUN 14 14  CREATININE 0.68 0.70  CALCIUM 9.0 9.4  MG 2.1 --  PHOS -- --    Basename 12/02/11 0550  AST 21  ALT 18  ALKPHOS 130*  BILITOT 0.2*  PROT 6.9  ALBUMIN 2.7*   No results found for this basename: LIPASE:2,AMYLASE:2 in the last 72 hours  Basename 12/02/11 0550 12/01/11 2020  WBC 11.8* 13.4*  NEUTROABS -- 10.9*  HGB 10.0* 11.1*  HCT 31.7* 34.1*  MCV 77.9* 75.9*  PLT 167 186   No results found for this basename: CKTOTAL:3,CKMB:3,CKMBINDEX:3,TROPONINI:3 in the last 72 hours No components found with this basename: POCBNP:3 No results found for this basename: DDIMER:2 in the last 72 hours No results found for this basename: HGBA1C:2 in the last 72  hours No results found for this basename: CHOL:2,HDL:2,LDLCALC:2,TRIG:2,CHOLHDL:2,LDLDIRECT:2 in the last 72 hours No results found for this basename: TSH,T4TOTAL,FREET3,T3FREE,THYROIDAB in the last 72 hours No results found for this basename: VITAMINB12:2,FOLATE:2,FERRITIN:2,TIBC:2,IRON:2,RETICCTPCT:2 in the last 72 hours  Micro Results: Recent Results (from the past 240 hour(s))  MRSA PCR SCREENING     Status: Abnormal   Collection Time   12/02/11  3:43 AM      Component Value Range Status Comment   MRSA by PCR POSITIVE (*) NEGATIVE  Final     Studies/Results: Dg Thoracic Spine 4v  12/01/2011  *RADIOLOGY REPORT*  Clinical Data: Status post multiple falls, with lower back pain and left hip pain.  THORACIC SPINE - 4+ VIEW  Comparison: Chest radiograph performed 08/30/2010, and CT of the abdomen and pelvis performed 08/20 01/2011  Findings: There is no evidence of acute fracture or subluxation. There is mild anterior loss of height at vertebral bodies the T11, T12 and L1, stable from the prior CT.  Vertebral bodies otherwise demonstrate normal height and alignment.  There is narrowing of the intervertebral disc space at T11-T12.  The visualized portions of both lungs are clear.  The mediastinum is unremarkable in appearance.  Contrast is noted within the colon.  IMPRESSION:  1.  No evidence of acute fracture or subluxation. 2.  Mild anterior loss of height at T11, T12 and L1 is stable from prior CT, with mild degenerative change; some of this may be developmental in nature.  Original Report Authenticated By: Tonia Ghent, M.D.   Dg Lumbar Spine Complete  12/01/2011  *RADIOLOGY REPORT*  Clinical Data: Status post multiple falls, with lower back pain and inability to urinate for 12 hours.  LUMBAR SPINE - COMPLETE 4+ VIEW  Comparison: CT of the abdomen and pelvis performed 07/28/2011  Findings: There is no evidence of acute fracture or subluxation. Mild anterior loss of height at T11, T12 and L1 is  stable from prior CT, with mild degenerative change at T11-T12.  Vertebral bodies demonstrate normal alignment.  The visualized bowel gas pattern is unremarkable in appearance; air and stool are noted within the colon.  The sacroiliac joints are within normal limits.  A right-sided tubal ligation clip is again noted; no left-sided tubal ligation clip is seen.  IMPRESSION:  1.  No evidence of acute fracture or subluxation along the lumbar spine. 2.  Stable mild anterior loss of height at T11 and T12 and L1; this may be developmental in nature. 3.  Right-sided tubal ligation clip noted; no left-sided tubal ligation clip seen.  This may reflect prior removal of the left- sided tubal ligation clip.  Original Report Authenticated By: Tonia Ghent, M.D.   Dg Hip Complete Left  12/01/2011  *RADIOLOGY REPORT*  Clinical Data: Status post multiple falls, with left hip pain.  LEFT HIP - COMPLETE 2+ VIEW  Comparison: None.  Findings: There is no evidence of fracture or dislocation.  Both femoral heads are seated normally within their respective acetabula.  The proximal left femur appears intact.  No significant degenerative change is appreciated.  The sacroiliac joints are unremarkable in appearance.  Note is made of mild diastasis of the pubic symphysis, measuring 1.5 cm.  The visualized bowel gas pattern is grossly unremarkable in appearance.  The patient is status post tubal ligation; no left- sided clip is seen.  On correlation with the prior CT, only a right- sided clip is present; this may reflect prior removal of a left- sided clip.  IMPRESSION:  1.  No evidence of fracture or dislocation. 2.  Mild diastases of the pubic symphysis, measuring 1.5 cm. 3.  No left-sided tubal ligation clip seen.  This was also the case on prior CTs; this may reflect prior removal of a left-sided clip. Suggest clinical correlation.  Original Report Authenticated By: Tonia Ghent, M.D.    Medications: I have reviewed the patient's  current medications.   Principal Problem:  *Low back pain    Assessment and plan:  -MRI of the lumbar spine is pending. Will increase antibiotics. We'll give her Ativan to get before the MRI. She related 1 mg of Dilaudid does not help.  -Continue her treatment for pneumonia she continues to be afebrile. Her white count continues to go down.   LOS: 1 day   Marinda Elk M.D. Pager: [phone] [facility] 12/02/2011, 9:14 AM

## 2011-12-02 NOTE — ED Provider Notes (Signed)
Medical screening examination/treatment/procedure(s) were conducted as a shared visit with non-physician practitioner(s) and myself.  I personally evaluated the patient during the encounter  Doug Sou, MD 12/02/11 0028

## 2011-12-02 NOTE — H&P (Signed)
Leslie Castillo is an 30 y.o. female.   PCP - None. Chief Complaint: Low back pain and difficulty walking. HPI: 30 year-old female with no significant past medical history presented to the ER because of worsening low back pain and difficulty walking. Patient states 3 days ago she had cough and feverish feeling and had gone to the ER and was told she had pneumonia and was prescribed doxycycline. The next day that is yesterday when she woke up and tried to walk she had intense pain in the low back which was radiating all the way to the left lower extremity. Whenever she put pressure on the leg due to intense pain and leg gave way. She fell 3 times due to the same reason. She came to the ER at [facility]. The patient had x-rays of the T-spine and lumbosacral spine and the left hip all of which does not show anything acute. Patient still had pain and inability to walk. Patient has been admitted for further management. Patient denies any incontinence of urine or bowels. Patient was found to have urinary retention in the ER in and out catheter. After patient came to the hospital patient was able to void urine spontaneously. Presently denies any chest pain or shortness of breath. #1  Past Medical History  Diagnosis Date  . Umbilical hernia   . Kidney stones   . Disc disorder   . Pneumonia     Past Surgical History  Procedure Date  . Appendectomy   . Cesarean section   . Tubal ligation   . Hernia repair     History reviewed. No pertinent family history. Social History:  reports that she has never smoked. She does not have any smokeless tobacco history on file. She reports that she does not drink alcohol or use illicit drugs.  Allergies:  Allergies  Allergen Reactions  . Compazine Other (See Comments)    hallucinations    Medications Prior to Admission  Medication Dose Route Frequency Provider Last Rate Last Dose  . 0.9 %  sodium chloride infusion   Intravenous Once Doug Sou, MD 20 mL/hr at 12/01/11 2051 1,000 mL at 12/01/11 2051  . 0.9 %  sodium chloride infusion   Intravenous Continuous Eduard Clos      . acetaminophen (TYLENOL) tablet 650 mg  650 mg Oral Q6H PRN Eduard Clos       Or  . acetaminophen (TYLENOL) suppository 650 mg  650 mg Rectal Q6H PRN Eduard Clos      . HYDROmorphone (DILAUDID) injection 0.5 mg  0.5 mg Intravenous Q2H PRN Eduard Clos      . HYDROmorphone (DILAUDID) injection 1 mg  1 mg Intravenous Once Doug Sou, MD   1 mg at 12/01/11 2024  . HYDROmorphone (DILAUDID) injection 1 mg  1 mg Intravenous Once Thomasene Lot, PA   1 mg at 12/01/11 2220  . HYDROmorphone (DILAUDID) injection 1 mg  1 mg Intravenous Once Doug Sou, MD   1 mg at 12/01/11 2354  . ketorolac (TORADOL) injection 60 mg  60 mg Intramuscular Once Thomasene Lot, PA   60 mg at 12/01/11 1930  . LORazepam (ATIVAN) tablet 1 mg  1 mg Oral Once Eduard Clos      . moxifloxacin (AVELOX) tablet 400 mg  400 mg Oral q1800 Eduard Clos      . ondansetron (ZOFRAN) injection 4 mg  4 mg Intravenous Once Doug Sou, MD   4 mg  at 12/01/11 2310  . ondansetron (ZOFRAN) tablet 4 mg  4 mg Oral Q6H PRN Eduard Clos       Or  . ondansetron (ZOFRAN) injection 4 mg  4 mg Intravenous Q6H PRN Eduard Clos      . ondansetron (ZOFRAN-ODT) disintegrating tablet 8 mg  8 mg Oral Once Thomasene Lot, PA   8 mg at 12/01/11 1930  . DISCONTD: HYDROmorphone (DILAUDID) injection 1 mg  1 mg Intravenous Q4H PRN Thomasene Lot, PA       Medications Prior to Admission  Medication Sig Dispense Refill  . doxycycline (VIBRAMYCIN) 100 MG capsule Take 100 mg by mouth 2 (two) times daily.          Results for orders placed during the hospital encounter of 12/01/11 (from the past 48 hour(s))  URINALYSIS, ROUTINE W REFLEX MICROSCOPIC     Status: Abnormal   Collection Time   12/01/11  5:59 PM      Component Value Range Comment     Color, Urine YELLOW  YELLOW     APPearance CLEAR  CLEAR     Specific Gravity, Urine 1.025  1.005 - 1.030     pH 6.0  5.0 - 8.0     Glucose, UA NEGATIVE  NEGATIVE (mg/dL)    Hgb urine dipstick NEGATIVE  NEGATIVE     Bilirubin Urine NEGATIVE  NEGATIVE     Ketones, ur 15 (*) NEGATIVE (mg/dL)    Protein, ur 30 (*) NEGATIVE (mg/dL)    Urobilinogen, UA 1.0  0.0 - 1.0 (mg/dL)    Nitrite NEGATIVE  NEGATIVE     Leukocytes, UA NEGATIVE  NEGATIVE    PREGNANCY, URINE     Status: Normal   Collection Time   12/01/11  5:59 PM      Component Value Range Comment   Preg Test, Ur NEGATIVE     URINE MICROSCOPIC-ADD ON     Status: Normal   Collection Time   12/01/11  5:59 PM      Component Value Range Comment   Squamous Epithelial / LPF RARE  RARE     RBC / HPF 0-2  <3 (RBC/hpf)    Bacteria, UA RARE  RARE    BASIC METABOLIC PANEL     Status: Abnormal   Collection Time   12/01/11  8:13 PM      Component Value Range Comment   Sodium 133 (*) 135 - 145 (mEq/L)    Potassium 3.3 (*) 3.5 - 5.1 (mEq/L)    Chloride 94 (*) 96 - 112 (mEq/L)    CO2 26  19 - 32 (mEq/L)    Glucose, Bld 121 (*) 70 - 99 (mg/dL)    BUN 14  6 - 23 (mg/dL)    Creatinine, Ser 9.60  0.50 - 1.10 (mg/dL)    Calcium 9.4  8.4 - 10.5 (mg/dL)    GFR calc non Af Amer >90  >90 (mL/min)    GFR calc Af Amer >90  >90 (mL/min)   CBC     Status: Abnormal   Collection Time   12/01/11  8:20 PM      Component Value Range Comment   WBC 13.4 (*) 4.0 - 10.5 (K/uL)    RBC 4.49  3.87 - 5.11 (MIL/uL)    Hemoglobin 11.1 (*) 12.0 - 15.0 (g/dL)    HCT 45.4 (*) 09.8 - 46.0 (%)    MCV 75.9 (*) 78.0 - 100.0 (fL)    MCH 24.7 (*) 26.0 -  34.0 (pg)    MCHC 32.6  30.0 - 36.0 (g/dL)    RDW 96.0  45.4 - 09.8 (%)    Platelets 186  150 - 400 (K/uL)   DIFFERENTIAL     Status: Abnormal   Collection Time   12/01/11  8:20 PM      Component Value Range Comment   Neutrophils Relative 82 (*) 43 - 77 (%)    Neutro Abs 10.9 (*) 1.7 - 7.7 (K/uL)    Lymphocytes  Relative 12  12 - 46 (%)    Lymphs Abs 1.7  0.7 - 4.0 (K/uL)    Monocytes Relative 6  3 - 12 (%)    Monocytes Absolute 0.8  0.1 - 1.0 (K/uL)    Eosinophils Relative 0  0 - 5 (%)    Eosinophils Absolute 0.0  0.0 - 0.7 (K/uL)    Basophils Relative 0  0 - 1 (%)    Basophils Absolute 0.0  0.0 - 0.1 (K/uL)    Dg Thoracic Spine 4v  12/01/2011  *RADIOLOGY REPORT*  Clinical Data: Status post multiple falls, with lower back pain and left hip pain.  THORACIC SPINE - 4+ VIEW  Comparison: Chest radiograph performed 08/30/2010, and CT of the abdomen and pelvis performed 08/20 01/2011  Findings: There is no evidence of acute fracture or subluxation. There is mild anterior loss of height at vertebral bodies the T11, T12 and L1, stable from the prior CT.  Vertebral bodies otherwise demonstrate normal height and alignment.  There is narrowing of the intervertebral disc space at T11-T12.  The visualized portions of both lungs are clear.  The mediastinum is unremarkable in appearance.  Contrast is noted within the colon.  IMPRESSION:  1.  No evidence of acute fracture or subluxation. 2.  Mild anterior loss of height at T11, T12 and L1 is stable from prior CT, with mild degenerative change; some of this may be developmental in nature.  Original Report Authenticated By: Tonia Ghent, M.D.   Dg Lumbar Spine Complete  12/01/2011  *RADIOLOGY REPORT*  Clinical Data: Status post multiple falls, with lower back pain and inability to urinate for 12 hours.  LUMBAR SPINE - COMPLETE 4+ VIEW  Comparison: CT of the abdomen and pelvis performed 07/28/2011  Findings: There is no evidence of acute fracture or subluxation. Mild anterior loss of height at T11, T12 and L1 is stable from prior CT, with mild degenerative change at T11-T12.  Vertebral bodies demonstrate normal alignment.  The visualized bowel gas pattern is unremarkable in appearance; air and stool are noted within the colon.  The sacroiliac joints are within normal limits.   A right-sided tubal ligation clip is again noted; no left-sided tubal ligation clip is seen.  IMPRESSION:  1.  No evidence of acute fracture or subluxation along the lumbar spine. 2.  Stable mild anterior loss of height at T11 and T12 and L1; this may be developmental in nature. 3.  Right-sided tubal ligation clip noted; no left-sided tubal ligation clip seen.  This may reflect prior removal of the left- sided tubal ligation clip.  Original Report Authenticated By: Tonia Ghent, M.D.   Dg Hip Complete Left  12/01/2011  *RADIOLOGY REPORT*  Clinical Data: Status post multiple falls, with left hip pain.  LEFT HIP - COMPLETE 2+ VIEW  Comparison: None.  Findings: There is no evidence of fracture or dislocation.  Both femoral heads are seated normally within their respective acetabula.  The proximal left femur appears intact.  No significant degenerative change is appreciated.  The sacroiliac joints are unremarkable in appearance.  Note is made of mild diastasis of the pubic symphysis, measuring 1.5 cm.  The visualized bowel gas pattern is grossly unremarkable in appearance.  The patient is status post tubal ligation; no left- sided clip is seen.  On correlation with the prior CT, only a right- sided clip is present; this may reflect prior removal of a left- sided clip.  IMPRESSION:  1.  No evidence of fracture or dislocation. 2.  Mild diastases of the pubic symphysis, measuring 1.5 cm. 3.  No left-sided tubal ligation clip seen.  This was also the case on prior CTs; this may reflect prior removal of a left-sided clip. Suggest clinical correlation.  Original Report Authenticated By: Tonia Ghent, M.D.    Review of Systems  HENT: Negative.   Eyes: Negative.   Respiratory: Negative.   Cardiovascular: Negative.   Gastrointestinal: Negative.   Genitourinary: Negative.   Musculoskeletal: Positive for falls.       LOW BACK PAIN.  Skin: Negative.   Neurological:       WEAKNESS IN THE LEFT LOWER EXTREMITY.    Endo/Heme/Allergies: Negative.   Psychiatric/Behavioral: Negative.     Blood pressure 125/76, pulse 96, temperature 98.9 F (37.2 C), temperature source Oral, resp. rate 20, height 5\' 4"  (1.626 m), weight 146.512 kg (323 lb), last menstrual period 11/06/2011, SpO2 97.00%. Physical Exam  Constitutional: She is oriented to person, place, and time. She appears well-developed and well-nourished. No distress.  HENT:  Head: Normocephalic and atraumatic.  Right Ear: External ear normal.  Left Ear: External ear normal.  Nose: Nose normal.  Mouth/Throat: Oropharynx is clear and moist. No oropharyngeal exudate.  Eyes: Conjunctivae and EOM are normal. Pupils are equal, round, and reactive to light. Right eye exhibits no discharge. Left eye exhibits no discharge.  Neck: Normal range of motion. Neck supple. No JVD present.  Cardiovascular: Normal rate, regular rhythm and intact distal pulses.  Exam reveals no friction rub.   No murmur heard. Respiratory: Effort normal and breath sounds normal. No respiratory distress. She has no wheezes. She has no rales.  GI: Soft. Bowel sounds are normal. She exhibits no distension. There is no tenderness. There is no rebound.       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  Musculoskeletal: She exhibits no edema.  Neurological: She is alert and oriented to person, place, and time.       Patient has no facial asymmetry. No neck rigidity. Able to move both upper extremities without difficulty. Patient is able to move her right lower extremity without any pain. Patient is able to move her left lower extremity but feels little weak strength is around 3/5 and is also limited by pain. Her reflexes are good. Reflexes in the right lower extremity is better than the left lower extremity.  Skin: She is not diaphoretic.  Psychiatric: Her behavior is normal.     Assessment/Plan #1. Low back pain with difficulty walking - patient's reflexes are intact. Patient's pain is increased  on lifting her left leg. Most likely this could be I nerve root compression due to disc prolapse. I am ordering MRI of the lumbosacral spine and T-spine. Presently we will keep patient pain relief medications and also get physical therapy. Patient will be placed on one dose of Ativan an hour before MRI for claustrophobia. Further plans based on MRI findings. #2. Recently treated for pneumonia - patient still has mild leukocytosis and  she had taken 2 days of antibiotics. We will continue with Avelox while patient is in the hospital. #3. 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  We will get a urine pregnancy screen.  CODE STATUS - full code.  Jessica Checketts N. 12/02/2011, 3:08 AM

## 2011-12-03 MED ORDER — OXYCODONE-ACETAMINOPHEN 5-325 MG PO TABS: 1.0000 | ORAL_TABLET | ORAL | Status: AC | PRN

## 2011-12-03 MED ORDER — LORAZEPAM 2 MG PO TABS: 2.0000 mg | ORAL_TABLET | Freq: Four times a day (QID) | ORAL | Status: AC | PRN

## 2011-12-03 NOTE — Discharge Summary (Signed)
Physician Discharge Summary  Patient ID: Leslie Castillo MRN: [id number] DOB/AGE: 03-24-1981 30 y.o.  Admit date: 12/01/2011 Discharge date: 12/03/2011  Admission Diagnoses:  Discharge Diagnoses:  Principal Problem:  *Low back pain   Discharged Condition: fair  Hospital Course: 30 y/o cf, recently seen at ED for Rx PNA (12.23) admit from Ed with significant LE pain after unintentional fall.  nmoted to have LLE having some "giving-away", X-rays of the T-spine and lumbosacral spine and the left hip all of which does not show anything acute. Patient still had pain and inability to walk. Patient has been admitted for further management. Patient denies any incontinence of urine or bowels. Patient was found to have urinary retention in the ER -noted by EDP to be able to move LLE with distraction and also noted thought was of likely conversion disorder as a possibility.  MRI ordered but could not be performed 2/2 to patient girth PT evaluated patient, patient claimed not able to cooperate with exam due to LLE giving away, but paradoxically was able to transfer from bed-chair without any assitance   Consults: none  Significant Diagnostic Studies: radiology: CT HIPS  IMPRESSION:  Limited detail study because of patient's size.  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 Some narrowing of the canal without  definite neural compression.  Chronic end plate Schmorl's nodes at T12-L1 and L1-2. No suspicion  of neural compression at those levels.  L4-5: Shallow circumferential protrusion of disc material. Facet  and ligamentous hypertrophy. Mild multifactorial stenosis  particularly in the lateral recesses.  L5-S1: Chronic disc degeneration with chronic annular bulging and  calcification. Some facet degeneration hypertrophy. Narrowing of  the lateral recesses and foramina, right more than left.   Treatments: IV hydration and antibiotics:  Moxifloxacin-->doxicycline  Discharge Exam: Blood pressure 117/62, pulse 117, temperature 99.2 F (37.3 C), temperature source Oral, resp. rate 18, height 5\' 4"  (1.626 m), weight 146.512 kg (323 lb), last menstrual period 11/06/2011, SpO2 98.00%. General appearance: alert, cooperative and appears stated age Head: Normocephalic, without obvious abnormality, atraumatic Throat: lips, mucosa, and tongue normal; teeth and gums normal Resp: clear to auscultation bilaterally Cardio: regular rate and rhythm, S1, S2 normal, no murmur, click, rub or gallop GI: soft, non-tender; bowel sounds normal; no masses,  no organomegaly Extremities: extremities normal, atraumatic, no cyanosis or edema Neurologic: Grossly normal, able to get from bed to stand without much difficulty, but then not able to stand on toes of LLE.  Affect initally very tearful, but fleetingly changed to one of disinterest and repeatedly asked "when can i go home"  Disposition: Home or Self Care  Discharge Orders    Future Orders Please Complete By Expires   Walker rolling      Diet - low sodium heart healthy      Increase activity slowly      Call MD for:  temperature >100.4      Call MD for:  severe uncontrolled pain      Call MD for:  hives      Call MD for:  extreme fatigue      Call MD for:  persistant dizziness or light-headedness        Medication List  As of 12/03/2011 10:03 AM   START taking these medications         LORazepam 2 MG tablet   Commonly known as: ATIVAN   Take 1 tablet (2 mg total) by mouth every 6 (six) hours as needed for anxiety (  spasm).      oxyCODONE-acetaminophen 5-325 MG per tablet   Commonly known as: PERCOCET   Take 1 tablet by mouth every 4 (four) hours as needed for pain.         CONTINUE taking these medications         acetaminophen 500 MG tablet   Commonly known as: TYLENOL      chlorpheniramine-HYDROcodone 10-8 MG/5ML Lqcr   Commonly known as: TUSSIONEX      doxycycline 100  MG capsule   Commonly known as: VIBRAMYCIN          Where to get your medications    These are the prescriptions that you need to pick up.   You may get these medications from any pharmacy.         LORazepam 2 MG tablet   oxyCODONE-acetaminophen 5-325 MG per tablet           Needs a PCP--Establishing with [facility] Explained to patient would need to lose girth and weight to aid in back pain Rx--nothing on CT scan suggests anything surgically amenable to Rx--Advised to follow with Ortho as an outpt  Signed: Daniela Siebers,JAI 12/03/2011, 10:03 AM

## 2011-12-03 NOTE — Progress Notes (Signed)
Physical Therapy Evaluation Patient Details Name: Leslie Castillo MRN: [id number] DOB: [birth year] Today's Date: 12/03/2011  Problem List:  Patient Active Problem List  Diagnoses  . Low back pain    Past Medical History:  Past Medical History  Diagnosis Date  . Umbilical hernia   . Kidney stones   . Disc disorder   . Pneumonia    Past Surgical History:  Past Surgical History  Procedure Date  . Appendectomy   . Cesarean section   . Tubal ligation   . Hernia repair     PT Assessment/Plan/Recommendation PT Assessment Clinical Impression Statement: Pt presents with low back pain and generalized weakness. Unable to determine if physical immobility is caused by physical impairments or cognitive deficits. Will continue to follow acutely in order to maximize mobility for a safe d/c home PT Recommendation/Assessment: Patient will need skilled PT in the acute care venue PT Problem List: Decreased strength;Decreased activity tolerance;Decreased mobility;Decreased knowledge of use of DME;Pain PT Therapy Diagnosis : Acute pain;Difficulty walking;Generalized weakness PT Plan PT Frequency: Min 3X/week PT Treatment/Interventions: DME instruction;Stair training;Gait training;Functional mobility training;Therapeutic activities;Therapeutic exercise;Patient/family education PT Recommendation Follow Up Recommendations: 24 hour supervision/assistance Equipment Recommended: Rolling walker with 5" wheels PT Goals  Acute Rehab PT Goals PT Goal Formulation: With patient Time For Goal Achievement: 7 days Pt will go Supine/Side to Sit: with modified independence PT Goal: Supine/Side to Sit - Progress: Progressing toward goal Pt will go Sit to Supine/Side: with modified independence PT Goal: Sit to Supine/Side - Progress: Progressing toward goal Pt will go Sit to Stand: with modified independence PT Goal: Sit to Stand - Progress: Progressing toward goal Pt will go Stand to Sit: with modified  independence PT Goal: Stand to Sit - Progress: Progressing toward goal Pt will Transfer Bed to Chair/Chair to Bed: with modified independence PT Transfer Goal: Bed to Chair/Chair to Bed - Progress: Progressing toward goal Pt will Ambulate: >150 feet;with modified independence;with least restrictive assistive device PT Goal: Ambulate - Progress: Progressing toward goal Pt will Go Up / Down Stairs: 6-9 stairs;with supervision;with rail(s) PT Goal: Up/Down Stairs - Progress: Not met  PT Evaluation Precautions/Restrictions  Restrictions Weight Bearing Restrictions: No Prior Functioning  Home Living Lives With: Spouse;Daughter;Son [place] Help From: Family Type of Home: House Home Layout: One level Home Access: Stairs to enter Entrance Stairs-Rails: Can reach both Entrance Stairs-Number of Steps: 6 Bathroom Shower/Tub: Engineer, manufacturing systems: Standard Bathroom Accessibility: Yes How Accessible: Accessible via walker Home Adaptive Equipment: None Prior Function Level of Independence: Independent with basic ADLs;Independent with gait;Independent with transfers;Independent with homemaking with ambulation Able to Take Stairs?: Yes Driving: Yes 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) Cognition Cognition Arousal/Alertness: Awake/alert Overall Cognitive Status: Appears within functional limits for tasks assessed Sensation/Coordination Sensation Light Touch: Appears Intact Extremity Assessment RLE Assessment RLE Assessment: Exceptions to [facility] RLE Strength RLE Overall Strength: Deficits RLE Overall Strength Comments: Overall 3/5 with MMT, pt unable to take any resistance LLE Assessment LLE Assessment: Exceptions to [facility] LLE Strength LLE Overall Strength: Deficits LLE Overall Strength Comments: Overall 3/5 with MMT, pt unable to take any resistance Mobility (including Balance) Bed Mobility Bed Mobility: Yes Sitting - Scoot to Edge of Bed: 4: Min assist;With  rail Sitting - Scoot to [place] of Bed Details (indicate cue type and reason): VC for weight shifting to assist ease with scooting Sit to Supine - Left: 4: Min assist;HOB elevated (comment degrees);With rail (30) Sit to Supine - Left Details (indicate cue type and reason):  Assist with LLE secondary to weakness. VC for sequencing and technique Transfers Transfers: Yes Sit to Stand: 4: Min assist;With upper extremity assist;From bed Sit to Stand Details (indicate cue type and reason): VC for sequencing and hand placement. Pt with LLE buckling upon standing, therefore completed sit to stand twice without sucessful ambulation Stand to Sit: 4: Min assist;With upper extremity assist;To bed Stand to Sit Details: VC for hand placement Ambulation/Gait Ambulation/Gait: Yes Ambulation/Gait Assistance: Not tested (comment) (pt unable to complete secondary to buckling with standing)    Exercise  General Exercises - Lower Extremity Hip Flexion/Marching: AROM;Strengthening;Both;5 reps;Standing End of Session PT - End of Session Equipment Utilized During Treatment: Gait belt Activity Tolerance: Patient limited by pain Patient left: in bed;with call bell in reach Nurse Communication: Mobility status for transfers;Mobility status for ambulation General Behavior During Session: [facility] for tasks performed Cognition: [facility] for tasks performed  Milana Kidney 12/03/2011, 2:44 PM  12/03/2011 Milana Kidney DPT PAGER: [phone] OFFICE: [phone]

## 2011-12-03 NOTE — Progress Notes (Signed)
   CARE MANAGEMENT NOTE 12/03/2011  Patient:  Leslie Castillo,Leslie Castillo   Account Number:  [id number]  Date Initiated:  12/03/2011  Documentation initiated by:  [facility]  Subjective/Objective Assessment:   Admitted with low back pain.     Action/Plan:   PT eval- patient refused to work with PT   Anticipated DC Date:  12/03/2011   Anticipated DC Plan:  HOME/SELF CARE      DC Planning Services  [facility] consult  [facility]      Choice offered to / List presented to:     DME arranged  Levan Hurst      DME agency  [facility]        Status of service:  Completed, signed off Medicare Important Message given?   (If response is "NO", the following Medicare IM given date fields will be blank) Date Medicare IM given:   Date Additional Medicare IM given:    Discharge Disposition:  HOME/SELF CARE  Per UR Regulation:    Comments:  12/03/11 Spoke with patient about PCP. She agreed to appt with [facility]. Made first available appt for eligibillity for 12/30/11 at 2:30pm and appt with Dr. Andrey Campanile 02/01/12 at 2:30pm. Gave patient appt times, list of what to take to [facility] for eligibility and discount pharmacy card. Contacted Justin at [facility] and requested rolling walker to be delivered to patient prior to discharge. Jacquelynn Cree RN, BSN, CCM

## 2012-06-06 IMAGING — CT CT L SPINE W/ CM
3 of 6 series · 13 of 33 positions shown, 16 images · IV contrast (agent unspecified)
Comparison: Radiography [DATE], CT 03/16/2011.

CLINICAL DATA: Back pain.  Recent fall.

CT LUMBAR SPINE WITH CONTRAST
TECHNIQUE: Multidetector CT imaging of the lumbar spine was
performed with intravenous contrast administration. Multiplanar CT
image reconstructions were also generated.
Contrast:  100 ml Lmnipaque-TZZ

[Series 4: t/l_spine 2.0 b31s st · axial · 0.28mm/px · z∈[-210,-60]mm · 6 of 107 slices shown, 8 images]
[im 16/107  soft-tissue]
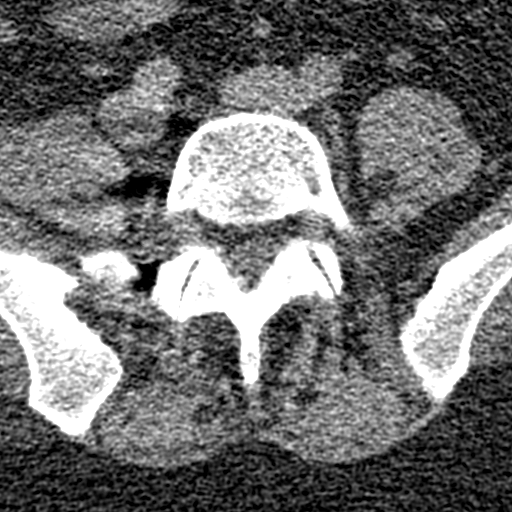
[im 16/107  bone]
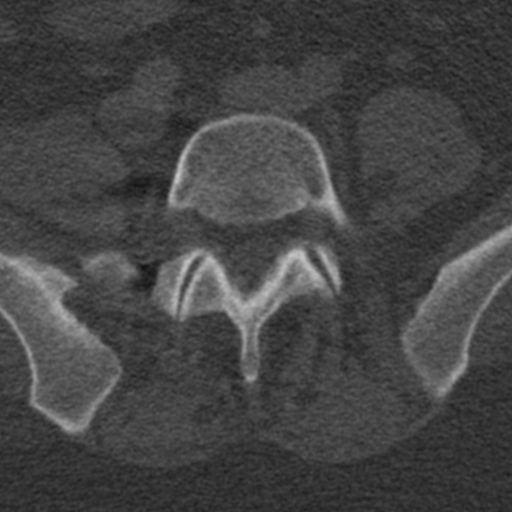
[im 31/107  bone]
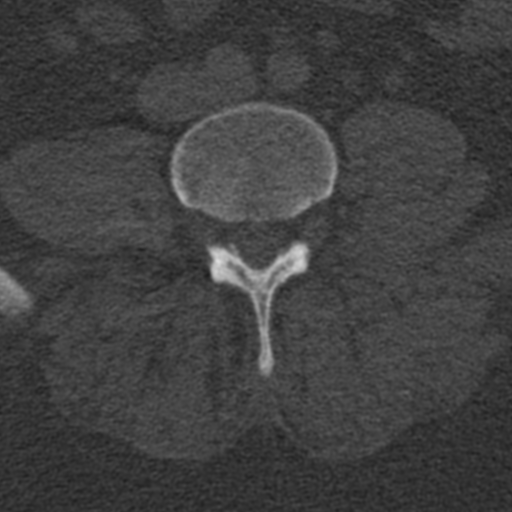
[im 46/107  bone]
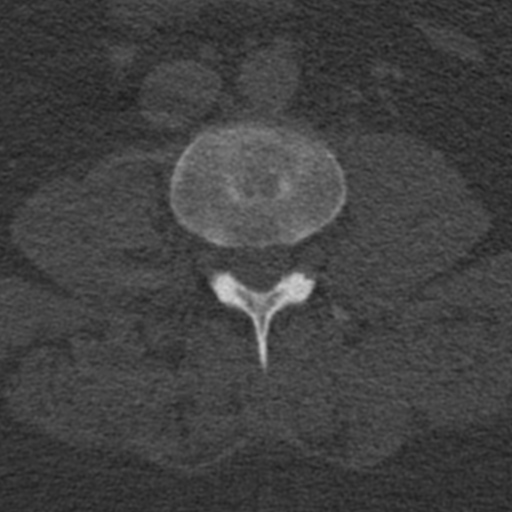
[im 61/107  bone]
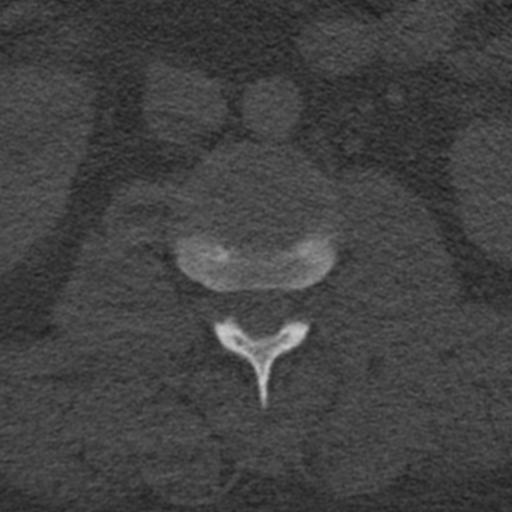
[im 76/107  soft-tissue]
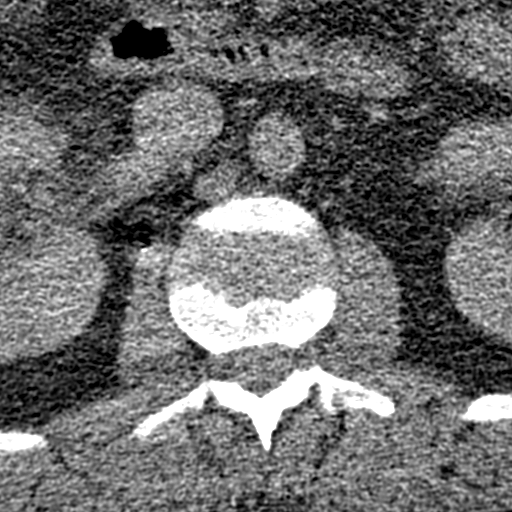
[im 76/107  bone]
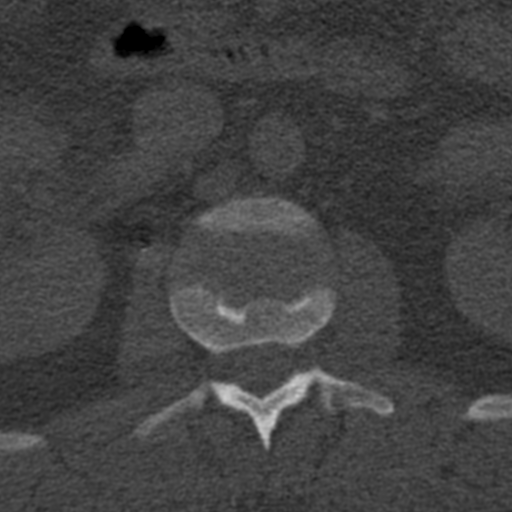
[im 91/107  bone]
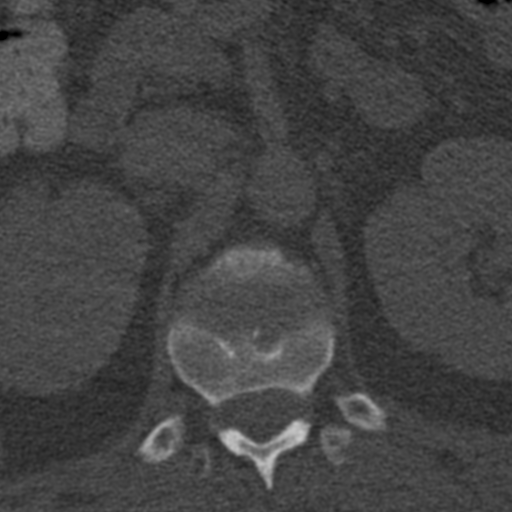

[Series 6: coronals · coronal · 0.41mm/px · 2 of 83 slices shown]
[im 28/83  bone]
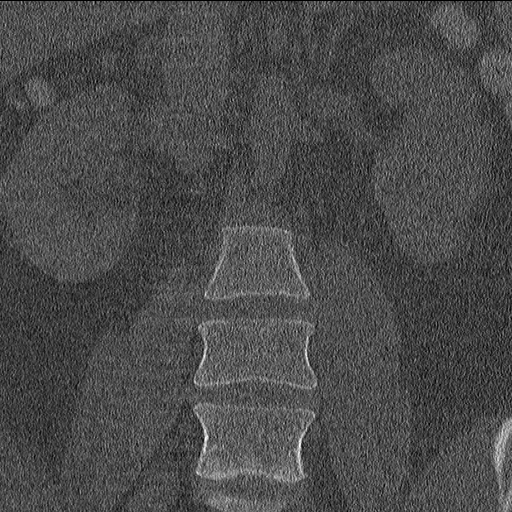
[im 55/83  bone]
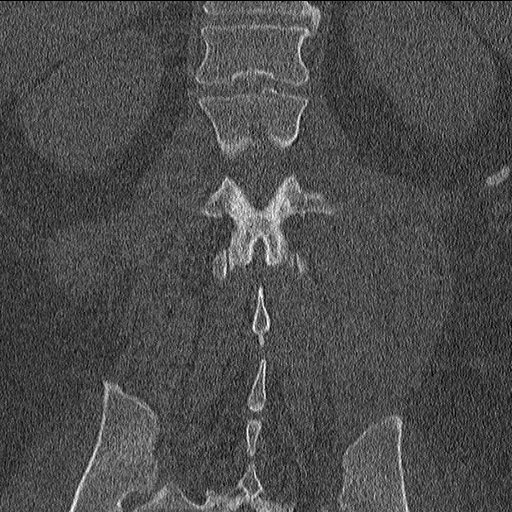

[Series 603: sag soft tissue · sagittal · 0.42mm/px · 5 of 34 slices shown, 6 images]
[im 12/34  bone]
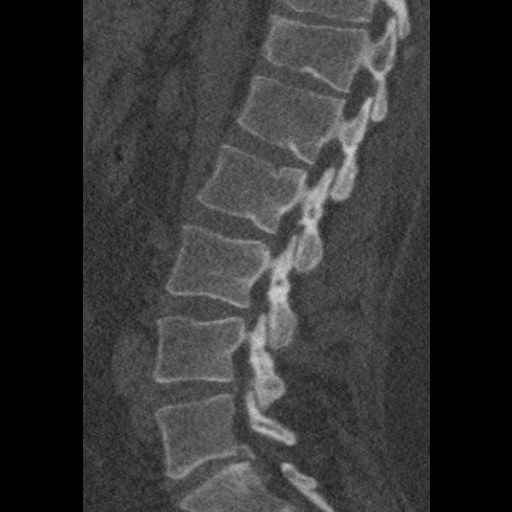
[im 14/34  bone]
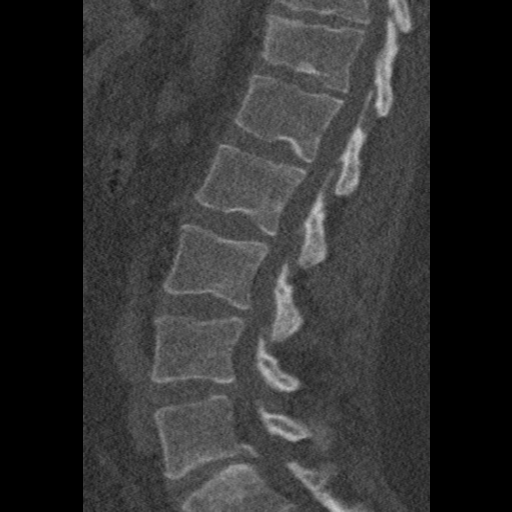
[im 17/34  soft-tissue]
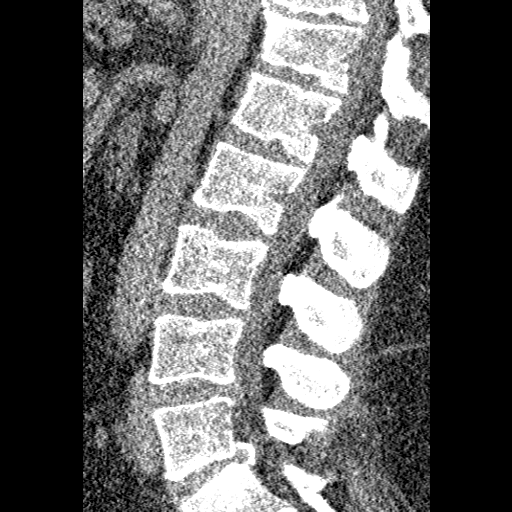
[im 17/34  bone]
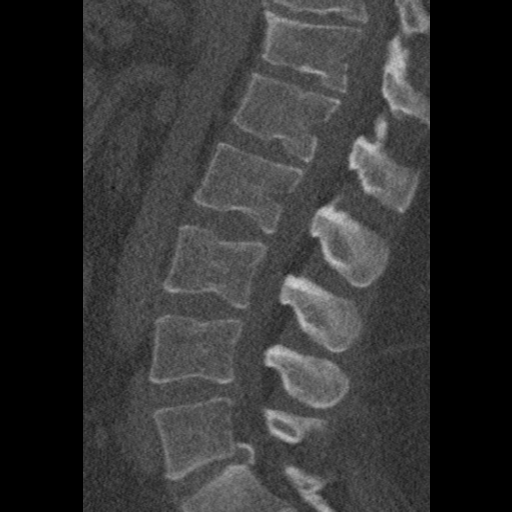
[im 20/34  bone]
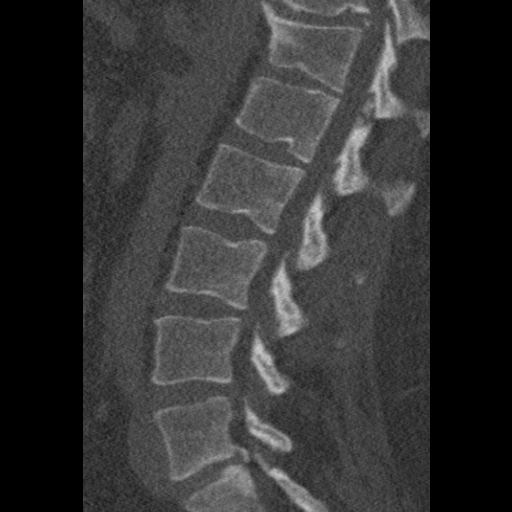
[im 23/34  bone]
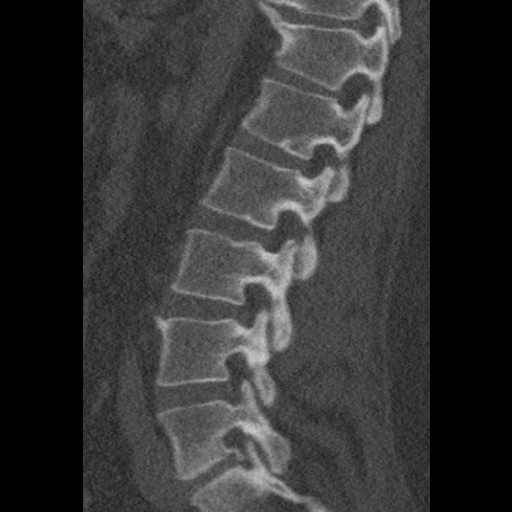

[13 of 33 positions shown; findings below may reference images not displayed]

FINDINGS: Detail is limited because of the patient's large size.

T11-12:  There is chronic disc degeneration with a chronic left
paracentral disc herniation.  There are chronic end plate Schmorl's
nodes.  There is mild canal stenosis.  Findings appear chronic.

T12-L1:  There are chronic end plate Schmorl's nodes.  There is
mild bulging of the disc.  Mild narrowing of the canal without
suspicion of neural compression.

L1-2:  Small endplate Schmorl's nodes.  No canal or foraminal
pathology suspected.

L2-3:  Unremarkable interspace.

L3-4:  Mild bulging of the disc.  No apparent compressive stenosis.

L4-5:  Circumferential bulging of the disc.  Mild facet and
ligamentous hypertrophy.  Mild multifactorial stenosis.

L5-S1:  Chronic disc degeneration with chronic annular bulging and
calcification.  Facet degeneration hypertrophy.  Narrowing of the
lateral recesses and foramina, right more than left.  Findings
appear chronic.
IMPRESSION: Limited detail study because of patient's size.

T11-12:  Chronic disc degeneration with chronic right
posterolateral herniation.  Some narrowing of the canal without
definite neural compression.

Chronic end plate Schmorl's nodes at T12-L1 and L1-2.  No suspicion
of neural compression at those levels.

L4-5:  Shallow circumferential protrusion of disc material.  Facet
and ligamentous hypertrophy.  Mild multifactorial stenosis
particularly in the lateral recesses.

L5-S1:  Chronic disc degeneration with chronic annular bulging and
calcification.  Some facet degeneration hypertrophy.  Narrowing of
the lateral recesses and foramina, right more than left.

## 2013-12-05 ENCOUNTER — Emergency Department (HOSPITAL_BASED_OUTPATIENT_CLINIC_OR_DEPARTMENT_OTHER)
Admission: EM | Admit: 2013-12-05 | Discharge: 2013-12-05 | Disposition: A | Discharge status: Home / Self Care | Payer: Self-pay | Attending: Emergency Medicine | Admitting: Emergency Medicine

## 2013-12-05 ENCOUNTER — Encounter (HOSPITAL_BASED_OUTPATIENT_CLINIC_OR_DEPARTMENT_OTHER): Payer: Self-pay | Admitting: Emergency Medicine

## 2013-12-05 DIAGNOSIS — Z87442 Personal history of urinary calculi: Secondary | ICD-10-CM | POA: Insufficient documentation

## 2013-12-05 DIAGNOSIS — Z8739 Personal history of other diseases of the musculoskeletal system and connective tissue: Secondary | ICD-10-CM | POA: Insufficient documentation

## 2013-12-05 DIAGNOSIS — H9209 Otalgia, unspecified ear: Secondary | ICD-10-CM | POA: Insufficient documentation

## 2013-12-05 DIAGNOSIS — Z8719 Personal history of other diseases of the digestive system: Secondary | ICD-10-CM | POA: Insufficient documentation

## 2013-12-05 DIAGNOSIS — H9201 Otalgia, right ear: Secondary | ICD-10-CM

## 2013-12-05 DIAGNOSIS — Z8701 Personal history of pneumonia (recurrent): Secondary | ICD-10-CM | POA: Insufficient documentation

## 2013-12-05 MED ORDER — NAPROXEN 375 MG PO TABS: 375.0000 mg | ORAL_TABLET | Freq: Two times a day (BID) | ORAL | Status: AC

## 2013-12-05 MED ORDER — NEOMYCIN-POLYMYXIN-HC 3.5-10000-1 OT SUSP: 3.0000 [drp] | Freq: Four times a day (QID) | OTIC | Status: AC

## 2013-12-05 NOTE — ED Notes (Signed)
Pt c/o right ear pain x 2 days 

## 2013-12-05 NOTE — ED Provider Notes (Signed)
CSN: [id number]     Arrival date & time 12/05/13  2219 History  This chart was scribed for Payson Crumby Smitty Cords, MD by Quintella Reichert, ED scribe.  This patient was seen in room MH11/MH11 and the patient's care was started at 11:17 PM.   Chief Complaint  Patient presents with  . Otalgia    Patient is a 33 y.o. female presenting with ear pain. The history is provided by the patient. No language interpreter was used.  Otalgia Location:  Right Behind ear:  No abnormality Quality:  Aching Severity:  Severe Duration:  2 days Timing:  Constant Progression:  Worsening Chronicity:  New Context: not direct blow and not foreign body in ear   Relieved by:  Nothing Ineffective treatments:  OTC medications Associated symptoms: no fever   Risk factors: no recent travel     HPI Comments: Leslie Castillo is a 32 y.o. female who presents to the Emergency Department complaining of 2 days of constant, severe, progressively-worsening right ear pain with associated drainage.  Pt states the pain has begun radiating into the right side of her jaw and throat.  She has attempted to treat pain with Tylenol and ibuprofen, without relief.  She denies left ear pain.  She denies recent trauma.  She denies medication allergies.   Past Medical History  Diagnosis Date  . Umbilical hernia   . Kidney stones   . Disc disorder   . Pneumonia     Past Surgical History  Procedure Laterality Date  . Appendectomy    . Cesarean section    . Tubal ligation    . Hernia repair      History reviewed. No pertinent family history.   History  Substance Use Topics  . Smoking status: Never Smoker   . Smokeless tobacco: Not on file  . Alcohol Use: No    OB History   Grav Para Term Preterm Abortions TAB SAB Ect Mult Living                  Review of Systems  Constitutional: Negative for fever.  HENT: Positive for ear pain.   All other systems reviewed and are negative.     Allergies   Compazine  Home Medications   Current Outpatient Rx  Name  Route  Sig  Dispense  Refill  . acetaminophen (TYLENOL) 500 MG tablet   Oral   Take 1,000 mg by mouth every 8 (eight) hours as needed. For fever          . chlorpheniramine-HYDROcodone (TUSSIONEX) 10-8 MG/5ML LQCR   Oral   Take 5 mLs by mouth every 12 (twelve) hours as needed. For cough           BP 124/86  Pulse 82  Temp(Src) 98.9 F (37.2 C) (Oral)  Resp 18  Ht 5\' 2"  (1.575 m)  Wt 275 lb (124.739 kg)  BMI 50.29 kg/m2  SpO2 99%  LMP 11/05/2013  Physical Exam  Nursing note and vitals reviewed. Constitutional: She is oriented to person, place, and time. She appears well-developed and well-nourished. No distress.  HENT:  Head: Normocephalic and atraumatic.  Right Ear: Tympanic membrane, external ear and ear canal normal. No mastoid tenderness. Tympanic membrane is not injected. No hemotympanum.  Left Ear: Tympanic membrane, external ear and ear canal normal. No mastoid tenderness. Tympanic membrane is not injected. No hemotympanum.  Mouth/Throat: Mucous membranes are normal.  No drainage in the right canal.  Right TM is completely normal. No mastoid  tenderness.  Eyes: EOM are normal. Pupils are equal, round, and reactive to light.  Neck: Normal range of motion. Neck supple. No tracheal deviation present.  Cardiovascular: Normal rate, regular rhythm and normal heart sounds.   Pulmonary/Chest: Effort normal and breath sounds normal. No respiratory distress. She has no wheezes. She has no rales.  Abdominal: Soft. Bowel sounds are normal. There is no tenderness. There is no rebound and no guarding.  Musculoskeletal: Normal range of motion.  Lymphadenopathy:    She has no cervical adenopathy.  Neurological: She is alert and oriented to person, place, and time.  Skin: Skin is warm and dry.  Psychiatric: She has a normal mood and affect. Her behavior is normal.    ED Course  Procedures (including critical care  time)  DIAGNOSTIC STUDIES: Oxygen Saturation is 99% on room air, normal by my interpretation.    COORDINATION OF CARE: 11:20 PM-Discussed treatment plan which includes ear drops with pt at bedside and pt agreed to plan.    Labs Review Labs Reviewed - No data to display  Imaging Review No results found.  EKG Interpretation   None       MDM  No diagnosis found. Will treat with cortisporin otic   I personally performed the services described in this documentation, which was scribed in my presence. The recorded information has been reviewed and is accurate.     Jasmine Awe, MD 12/06/13 [phone]

## 2015-09-20 NOTE — ED Notes (Signed)
No answer when called for triage 

## 2015-09-21 NOTE — ED Notes (Signed)
Called again for triage; checked the lobby bathroom; pt has left without being triaged
